# Patient Record
Sex: Female | Born: 1940 | Race: White | Hispanic: No | Marital: Married | Smoking: Never smoker
Health system: Southern US, Community
[De-identification: ages and names within clinical notes are randomized; demographics above are authoritative.]

## PROBLEM LIST (undated history)

## (undated) DIAGNOSIS — R413 Other amnesia: Secondary | ICD-10-CM

## (undated) DIAGNOSIS — M1990 Unspecified osteoarthritis, unspecified site: Secondary | ICD-10-CM

## (undated) DIAGNOSIS — C50919 Malignant neoplasm of unspecified site of unspecified female breast: Secondary | ICD-10-CM

## (undated) DIAGNOSIS — E7800 Pure hypercholesterolemia, unspecified: Secondary | ICD-10-CM

## (undated) DIAGNOSIS — G629 Polyneuropathy, unspecified: Secondary | ICD-10-CM

## (undated) DIAGNOSIS — I700 Atherosclerosis of aorta: Secondary | ICD-10-CM

## (undated) DIAGNOSIS — Z8619 Personal history of other infectious and parasitic diseases: Secondary | ICD-10-CM

## (undated) DIAGNOSIS — E049 Nontoxic goiter, unspecified: Secondary | ICD-10-CM

## (undated) DIAGNOSIS — M810 Age-related osteoporosis without current pathological fracture: Secondary | ICD-10-CM

## (undated) DIAGNOSIS — E119 Type 2 diabetes mellitus without complications: Secondary | ICD-10-CM

## (undated) HISTORY — PX: EYE SURGERY: SHX253

## (undated) HISTORY — PX: REPLACEMENT TOTAL KNEE: SUR1224

## (undated) HISTORY — DX: Pure hypercholesterolemia, unspecified: E78.00

## (undated) HISTORY — PX: MASTECTOMY: SHX3

## (undated) HISTORY — DX: Nontoxic goiter, unspecified: E04.9

## (undated) HISTORY — DX: Type 2 diabetes mellitus without complications: E11.9

## (undated) HISTORY — DX: Unspecified osteoarthritis, unspecified site: M19.90

## (undated) HISTORY — DX: Malignant neoplasm of unspecified site of unspecified female breast: C50.919

## (undated) HISTORY — PX: CATARACT EXTRACTION: SUR2

## (undated) HISTORY — DX: Atherosclerosis of aorta: I70.0

## (undated) HISTORY — DX: Other amnesia: R41.3

---

## 1997-12-13 ENCOUNTER — Ambulatory Visit (HOSPITAL_COMMUNITY): Admission: RE | Admit: 1997-12-13 | Discharge: 1997-12-13 | Payer: Self-pay | Admitting: Family Medicine

## 1998-09-17 ENCOUNTER — Encounter: Payer: Self-pay | Admitting: Family Medicine

## 1998-09-17 ENCOUNTER — Ambulatory Visit (HOSPITAL_COMMUNITY): Admission: RE | Admit: 1998-09-17 | Discharge: 1998-09-17 | Payer: Self-pay | Admitting: Family Medicine

## 2004-02-27 ENCOUNTER — Other Ambulatory Visit: Admission: RE | Admit: 2004-02-27 | Discharge: 2004-02-27 | Payer: Self-pay | Admitting: Family Medicine

## 2005-03-30 ENCOUNTER — Other Ambulatory Visit: Admission: RE | Admit: 2005-03-30 | Discharge: 2005-03-30 | Payer: Self-pay | Admitting: Family Medicine

## 2006-03-31 ENCOUNTER — Other Ambulatory Visit: Admission: RE | Admit: 2006-03-31 | Discharge: 2006-03-31 | Payer: Self-pay | Admitting: Family Medicine

## 2007-07-06 HISTORY — PX: REPLACEMENT TOTAL KNEE: SUR1224

## 2007-08-29 ENCOUNTER — Ambulatory Visit (HOSPITAL_COMMUNITY): Admission: RE | Admit: 2007-08-29 | Discharge: 2007-08-29 | Payer: Self-pay | Admitting: Family Medicine

## 2008-03-25 ENCOUNTER — Inpatient Hospital Stay (HOSPITAL_COMMUNITY): Admission: RE | Admit: 2008-03-25 | Discharge: 2008-03-28 | Payer: Self-pay | Admitting: Orthopedic Surgery

## 2008-05-23 ENCOUNTER — Other Ambulatory Visit: Admission: RE | Admit: 2008-05-23 | Discharge: 2008-05-23 | Payer: Self-pay | Admitting: Family Medicine

## 2009-03-17 ENCOUNTER — Encounter (INDEPENDENT_AMBULATORY_CARE_PROVIDER_SITE_OTHER): Payer: Self-pay | Admitting: *Deleted

## 2009-06-09 ENCOUNTER — Other Ambulatory Visit: Admission: RE | Admit: 2009-06-09 | Discharge: 2009-06-09 | Payer: Self-pay | Admitting: Family Medicine

## 2009-08-20 ENCOUNTER — Ambulatory Visit (HOSPITAL_BASED_OUTPATIENT_CLINIC_OR_DEPARTMENT_OTHER): Admission: RE | Admit: 2009-08-20 | Discharge: 2009-08-20 | Payer: Self-pay | Admitting: Plastic Surgery

## 2009-11-25 ENCOUNTER — Telehealth: Payer: Self-pay | Admitting: Internal Medicine

## 2010-06-19 ENCOUNTER — Ambulatory Visit
Admission: RE | Admit: 2010-06-19 | Discharge: 2010-06-19 | Payer: Self-pay | Source: Home / Self Care | Attending: Plastic Surgery | Admitting: Plastic Surgery

## 2010-07-26 ENCOUNTER — Encounter: Payer: Self-pay | Admitting: Family Medicine

## 2010-07-28 ENCOUNTER — Encounter
Admission: RE | Admit: 2010-07-28 | Discharge: 2010-07-28 | Payer: Self-pay | Source: Home / Self Care | Attending: Family Medicine | Admitting: Family Medicine

## 2010-08-01 NOTE — Op Note (Signed)
NAME:  Caroline, Pratt NO.:  [id number]  MEDICAL RECORD NO.:  [id number]          PATIENT TYPE:  AMB  LOCATION:  DSC                          FACILITY:  [facility]  PHYSICIAN:  Loreta Ave, MD DATE OF BIRTH:  [birth year]  DATE OF PROCEDURE:  06/19/2010 DATE OF DISCHARGE:                              OPERATIVE REPORT   SURGEON:  Loreta Ave, MD  PREOPERATIVE DIAGNOSIS:  Left-sided breast cancer.  POSTOPERATIVE DIAGNOSIS:  Left-sided breast cancer.  PROCEDURES PERFORMED: 1. Removal of left breast tissue expander. 2. Capsulotomy. 3. Placement of left breast permanent saline implant.  COMPONENTS USED:  A Mentor smooth round high profile saline implant, reference number 3500, lot number [id number], serial number [id number] 047, serial volume is 525 mL.  IV FLUIDS:  Per Anesthesia.  URINE OUTPUT:  Not recorded.  COMPLICATIONS:  None.  CLINICAL INDICATIONS:  Caroline Pratt is a 70 year old Caucasian female who is status post left mastectomy and breast reconstruction.  She suffered implant rupture with deflation of the left breast implant and she was then brought in my attention.  At that time as the laps seems to be deflation of her implant, it should be re-expansion before placement of a subsequent implant.  She understood the risks of surgery to include but not limited bleeding, infection, damage to nearby structures, stiffness, scarring, implant-related complications such as fatigue, migration, failure, breast asymmetry, and the need for more surgery. She desired to proceed.  DESCRIPTION OF OPERATION:  The patient was brought to the operating room and placed in the supine position on the operating room table.  After smooth and routine induction of general anesthesia with laryngeal mask airway, the patient's chest was prepped with ChloraPrep and draped into the sterile field.  Ancef 1 g was given.  A 5 mL of 0.5% Marcaine with epinephrine were  injected about the left mastectomy scar.  The left mastectomy scar was then incised with 15-blade and excised and sent to pathology labeled as left mastectomy scar.  Electrocautery dissection was used moving on the lateral portion of the breast, port the implant. The implant was encountered freed with digital dissection, ruptured with a 15-blade, and removed.  Next, an inferior capsulotomy was made along the inferior third of the breast just off the chest wall and a grid pattern was cut into the capsule approximately 2 cm x 2 cm over the inferior third of the breast to allow inferior pole expansion.  The left mastectomy defect was then irrigated with normal saline and hemostasis was verified with electrocautery.  The left mastectomy defect was then irrigated with bacitracin containing normal saline and then a sizer was placed.  We then filled with 500 mL of injectable normal saline and this was compared with the opposite breast.  We then fill the 560 and seems to be too large to the compared to the right breast.  The decision was made to proceed with a 500 mL implant, which was then brought onto the field and maintained in the bacitracin containing bath in normal saline. All residual air was aspirated out.  It was placed in the left  mastectomy defect and filled with a closed fill circuit to 525 mL. Symmetry was very good.  Next, the capsule layer overlying the implant was closed with interrupted 3-0 Monocryl sutures taking care not to touch the implant anytime with needle suture or instrument.  The skin was then closed with interrupted buried 3-0 Monocryl sutures and a running 4-0 subcuticular Monocryl stitch.  Dermabond and Steri-Strips as well as a compressive wrap with padding in the axilla to medial at the breast was then placed.  She was extubated and transferred to the recovery room in stable addition.     Loreta Ave, MD     CF/MEDQ  D:  06/19/2010  T:  06/20/2010   Job:  [id number]  Electronically Signed by Loreta Ave MD on 08/01/2010 03:14:13 PM

## 2010-08-06 NOTE — Letter (Signed)
Summary: Recall Colonoscopy Letter  [facility]  [address] [place], [place] [address]   Phone: [phone]  Fax: [phone]      March 17, 2009 MRN: [id number]   Caroline Pratt [address] [place], [place]  [address]   Dear Ms. Caroline Pratt,   According to your medical record, it is time for you to schedule a Colonoscopy. The American Cancer Society recommends this procedure as a method to detect early colon cancer. Patients with a family history of colon cancer, or a personal history of colon polyps or inflammatory bowel disease are at increased risk.  This letter has beeen generated based on the recommendations made at the time of your procedure. If you feel that in your particular situation this may no longer apply, please contact our office.  Please call our office at [phone] to schedule this appointment or to update your records at your earliest convenience.  Thank you for cooperating with [place] to provide you with the very best care possible.   Sincerely,  Hedwig Morton. Juanda Chance, M.D.  [facility] [phone]

## 2010-08-06 NOTE — Progress Notes (Signed)
Summary: Schedule Colonoscopy  Phone Note Outgoing Call Call back at [facility] Phone [phone]   Call placed by: Harlow Mares CMA Duncan Dull),  Nov 25, 2009 12:04 PM Call placed to: Patient Summary of Call: patient is declining to have her colonoscopy done I have explained the importance of having her colonoscopy she still declined.  Initial call taken by: Harlow Mares CMA Duncan Dull),  Nov 25, 2009 12:14 PM

## 2010-09-14 LAB — POCT HEMOGLOBIN-HEMACUE: Hemoglobin: 14.8 g/dL (ref 12.0–15.0)

## 2010-09-23 LAB — POCT HEMOGLOBIN-HEMACUE: Hemoglobin: 13.9 g/dL (ref 12.0–15.0)

## 2010-11-17 NOTE — Op Note (Signed)
NAME:  Caroline Pratt, KUNST NO.:  [id number]   MEDICAL RECORD NO.:  [id number]          PATIENT TYPE:  INP   LOCATION:  5031                         FACILITY:  [facility]   PHYSICIAN:  Feliberto Gottron. Turner Daniels, M.D.   DATE OF BIRTH:  [birth year]   DATE OF PROCEDURE:  03/25/2008  DATE OF DISCHARGE:                               OPERATIVE REPORT   PREOPERATIVE DIAGNOSIS:  End-stage arthritis, right knee.   POSTOPERATIVE DIAGNOSIS:  End-stage arthritis, right knee.   PROCEDURE:  Right total knee arthroplasty, DePuy Sigma RP components,  all cemented, 3 femur, 4 tibia and 35 patella, 10-mm Sigma RP bearing.   SURGEON:  Feliberto Gottron. Turner Daniels, MD   FIRST ASSISTANT:  Shirl Harris, PA-C   ANESTHETIC:  General endotracheal.   ESTIMATED BLOOD LOSS:  Minimal.   FLUID REPLACEMENT:  1500 mL of crystalloid.   DRAINS PLACED:  Foley catheter and 2 medium Hemovacs.   URINE OUTPUT:  300 mL.   TOURNIQUET TIME:  1 hour 20 minutes.   INDICATIONS FOR PROCEDURE:  A 70 year old woman with end-stage arthritis  of right greater than left knee who has failed conservative treatment  over the last few years with anti-inflammatory medicines, physical  therapy, cortisone injections and viscosupplementation.  She now desires  elective right total knee arthroplasty for bone-on-bone arthritic  changes verified by x-ray.  Risks and benefits of surgery were  discussed.  Questions answered.   DESCRIPTION OF PROCEDURE:  The patient was identified by armband and  underwent a right femoral nerve block in the block area at [facility].  She was then taken to operating room 1, the appropriate  anesthetic monitors were reattached, and general endotracheal anesthesia  induced with the patient in supine position.  She received a gram of  vancomycin preoperatively IV and a Foley catheter was inserted.  Tourniquet applied high to the right thigh, and the right lower  extremity prepped and draped in usual  sterile fashion.  The lateral post  and foot positioner applied to the table, standard time-out procedure  was then performed.  The limb was wrapped with an Esmarch bandage,  tourniquet inflated to 350 mmHg.  We began the procedure by making a  standard anterior midline incision starting handbreadth above the  patella going over the patella 1 cm medial and 2 and 3 cm distal to the  tibial tubercle.  Length of incision was about 18 cm.  Small bleeders in  the skin and the subcutaneous tissue were identified and cauterized.  Transverse retinaculum over the patella was incised and reflected  medially allowing a medial parapatellar arthrotomy.  The patella was  everted.  Prepatellar fat pad was resected.  Superficial medial  collateral ligament was elevated from the anterior to posterior off the  proximal tibia leaving intact distally to loosen up the MCL because of  her varus deformity.  At this point, the patella was everted.  The knee  was flexed.  The anterior one half of the menisci were resected as were  the cruciate ligaments and notch osteophytes removed.  Posteromedial Z  retractor and McHale retractor through the notch and a lateral Hohmann  retractor were then placed exposing the proximal tibia, which was then  entered with the DePuy step drill pretty much dead center in relation to  the tibial plateau.  This was followed by the IM rod set for 2 degrees  posterior slope on the cutting guide removing about 7 or 8 mm of bone  medially and almost a full centimeter of bone laterally.  Satisfied with  a proximal tibial cut, we then directed our attention to the distal  femur which was entered 2 mm anterior to the PCL origin with a step  drill followed by the IM rod set for 5 degrees right and 11-mm distal  cut.  The cutting guide was pinned along the epicondylar axis and the  distal femoral cut accomplished without difficulty.  We sized for a 3  right femoral component and drilled the  guide pins in 3 degrees of  external rotation followed by the chamfer cutting guide, which was held  in place with the screw pins.  Anterior, posterior, and chamfer cuts  were then accomplished without difficulty as was the DePuy Sigma RP box  cut with the box cutting guide.  Patella was measured at 23 mm  relatively small patella.  We set the cutting guide at 14 and removed  the posterior 9 mm of the patella, sized for a 35 button, and drilled.  At this point, the proximal tibia was once again exposed by hyperflexing  the knee.  We sized for 4 tibial baseplate, which was pinned into place  followed by the conical reaming tower and the conical reamer.  The  Deltafit keel punch was then applied.  The trial #3 right femoral  component was then hammered into place and the lugs were drilled.  We  snapped in a 10-mm bearing between the tibial trial baseplate and the  femur.  The knee came to full extension, flexed to 130 degrees, and  ligament laxity was minimal.  The trial patella tracked with no thumb  pressure.  At this point, the trial components were removed.  All bony  surfaces were WaterPik clean and dry with suction and sponges, and a  double batch of DePuy HV cement with 1500 mg of Zinacef was then mixed  and applied to all bony and metallic mating surfaces except for the  posterior condyles of the femur itself.  In order, we hammered into  place a #4 tibial baseplate and removed excess cement, a 3 right femoral  component and removed excess cement, and a 35-mm patellar button and  also removed excess cement.  This was held in place with a clamp.  A 10-  mm bearing was then snapped into place and the knee reduced, came to  full extension.  Excess cement was once again removed as we applied  compression, and the cement cured.  Medium Hemovac drains were then  placed deep in the wound using the lateral side and after again  irrigating the knee with normal saline solution, we checked our  patellar  tracking one more time after the cement had cured and the parapatellar  arthrotomy was closed with running #1 Vicryl suture.  The subcutaneous  tissue with 0 and 2-0 undyed Vicryl suture, and the skin with skin  staples.  A dressing of Xeroform, 4 x 4 dressing sponges, Webril, and  Ace wrap was then applied.  Tourniquet let down.  The patient awakened  and taken  to the recovery room without difficulty.      Feliberto Gottron. Turner Daniels, M.D.  Electronically Signed     FJR/MEDQ  D:  03/25/2008  T:  03/26/2008  Job:  [id number]

## 2010-11-17 NOTE — Discharge Summary (Signed)
NAME:  Caroline Pratt, Caroline Pratt NO.:  [id number]   MEDICAL RECORD NO.:  [id number]          PATIENT TYPE:  INP   LOCATION:  5031                         FACILITY:  [facility]   PHYSICIAN:  Feliberto Gottron. Turner Daniels, M.D.   DATE OF BIRTH:  24-Feb-1941   DATE OF ADMISSION:  03/25/2008  DATE OF DISCHARGE:  03/28/2008                               DISCHARGE SUMMARY   CHIEF COMPLAINT:  Right knee pain.   HISTORY OF PRESENT ILLNESS:  This is a 70 year old lady who complains of  unremitting pain in her right knee despite conservative treatment with  antiinflammatories, narcotic pain medicines, viscosupplementation and  knee arthroscopy.  She desires a surgical intervention at this time.  All risks and benefits of surgery were discussed with the patient.   PAST MEDICAL HISTORY:  Significant for breast cancer in 1990.   PAST SURGICAL HISTORY:  She had a mastectomy and a right knee  arthroscopy.   SOCIAL HISTORY:  She is a nonsmoker and does not drink alcohol.  She  lives with her husband.   FAMILY HISTORY:  Noncontributory.   ALLERGIES:  She has no known drug allergies.   DAILY MEDICATIONS:  1. Pravastatin 80 mg one p.o. daily.  2. Alendronate 70 mg one p.o. q. weekly.  3. Aspirin 81 mg one p.o. daily.  4. Temazepam one p.o. p.r.n. anxiety.  5. Fish oil.  6. Multivitamin.   PHYSICAL EXAMINATION:  Gross examination of the right knee demonstrates  that the patient has a 1+ effusion.  Range of motion is estimated to be  0-120 degrees.  She is neurovascularly intact.  X-rays demonstrate bone-  on-bone degenerative joint disease of the right knee.   PREOPERATIVE LABORATORY DATA:  White blood cells 6.0, red blood cells  4.45, hemoglobin 14.0, hematocrit 40.9, platelets 250.  PT 12.7, INR  0.9, PTT 30.  Sodium 140, potassium 4.4, chloride 105, glucose 95, BUN  14, creatinine 0.84.  Urinalysis demonstrates trace leukocyte esterase,  otherwise within normal limits.   HOSPITAL COURSE:  Ms.  Winther was admitted to [facility] on March 25, 2008, when she underwent a right total knee arthroplasty using the  DePuy System.  The surgery was performed by Gean Birchwood.  A  perioperative Foley catheter was placed and two Hemovac drains were  placed into the right knee.  The patient tolerated the procedure well  and was transferred to the orthopedic floor.  On the first postoperative  day, the patient was awake, alert and taking p.o. well.  Her hemoglobin  was 10.4.  Her surgical drains were removed without difficulty, and she  was evaluated by physical therapy.  On the second postoperative day, the  patient was eating well and passing flatus.  Her surgical dressing was  changed.  Her incision was found to be benign.  Hemoglobin was 10.5.  She was able to ambulate 180 feet with physical therapy.  On the third  postoperative day, the patient was eating well, voiding and ambulating  independently.  She had climbed stairs with physical therapy, hemoglobin  was 9.6, surgical dressing was  clean and dry.  She was discharged to her  home.   DISPOSITION:  The patient was discharged home on March 28, 2008.  [facility] her wound, Coumadin, and physical therapy.   Discharge medicines were as per the HMR with the addition of Coumadin 5  mg tablets to take as directed with a target INR of 1.5 to 2 and  Percocet 5 mg tablets 1-2 tablets p.o. q.4 h. p.r.n. pain.  She was  weightbearing as tolerated and will return to the clinic in 1 week for  reevaluation by Dr. Turner Daniels.   FINAL DIAGNOSIS:  End-stage degenerative joint disease.      Shirl Harris, PA      Feliberto Gottron. Turner Daniels, M.D.  Electronically Signed    JW/MEDQ  D:  05/12/2008  T:  05/12/2008  Job:  [id number]

## 2011-02-10 ENCOUNTER — Encounter (INDEPENDENT_AMBULATORY_CARE_PROVIDER_SITE_OTHER): Payer: Medicare Other | Admitting: Ophthalmology

## 2011-02-10 DIAGNOSIS — H35379 Puckering of macula, unspecified eye: Secondary | ICD-10-CM

## 2011-02-10 DIAGNOSIS — H43819 Vitreous degeneration, unspecified eye: Secondary | ICD-10-CM

## 2011-02-10 DIAGNOSIS — H3530 Unspecified macular degeneration: Secondary | ICD-10-CM

## 2011-02-10 DIAGNOSIS — H35359 Cystoid macular degeneration, unspecified eye: Secondary | ICD-10-CM

## 2011-03-16 ENCOUNTER — Encounter (INDEPENDENT_AMBULATORY_CARE_PROVIDER_SITE_OTHER): Payer: Medicare Other | Admitting: Ophthalmology

## 2011-03-18 ENCOUNTER — Encounter (INDEPENDENT_AMBULATORY_CARE_PROVIDER_SITE_OTHER): Payer: Medicare Other | Admitting: Ophthalmology

## 2011-03-18 DIAGNOSIS — H35349 Macular cyst, hole, or pseudohole, unspecified eye: Secondary | ICD-10-CM

## 2011-03-18 DIAGNOSIS — H43819 Vitreous degeneration, unspecified eye: Secondary | ICD-10-CM

## 2011-03-18 DIAGNOSIS — H35379 Puckering of macula, unspecified eye: Secondary | ICD-10-CM

## 2011-03-25 ENCOUNTER — Ambulatory Visit (HOSPITAL_COMMUNITY)
Admission: RE | Admit: 2011-03-25 | Discharge: 2011-03-26 | Disposition: A | Discharge status: Home / Self Care | Payer: Medicare Other | Source: Ambulatory Visit | Attending: Ophthalmology | Admitting: Ophthalmology

## 2011-03-25 DIAGNOSIS — H26499 Other secondary cataract, unspecified eye: Secondary | ICD-10-CM

## 2011-03-25 DIAGNOSIS — H35349 Macular cyst, hole, or pseudohole, unspecified eye: Secondary | ICD-10-CM

## 2011-03-25 DIAGNOSIS — Z853 Personal history of malignant neoplasm of breast: Secondary | ICD-10-CM | POA: Insufficient documentation

## 2011-03-25 DIAGNOSIS — H35379 Puckering of macula, unspecified eye: Secondary | ICD-10-CM

## 2011-03-25 DIAGNOSIS — Z01812 Encounter for preprocedural laboratory examination: Secondary | ICD-10-CM | POA: Insufficient documentation

## 2011-03-25 LAB — CBC
HCT: 37.9 % (ref 36.0–46.0)
Hemoglobin: 13.5 g/dL (ref 12.0–15.0)
MCH: 31.3 pg (ref 26.0–34.0)
MCHC: 35.6 g/dL (ref 30.0–36.0)
MCV: 87.7 fL (ref 78.0–100.0)
Platelets: 248 10*3/uL (ref 150–400)
RBC: 4.32 MIL/uL (ref 3.87–5.11)
RDW: 12.8 % (ref 11.5–15.5)
WBC: 5.9 10*3/uL (ref 4.0–10.5)

## 2011-03-25 LAB — AUTOLOGOUS SERUM PATCH PREP

## 2011-03-25 LAB — SURGICAL PCR SCREEN
MRSA, PCR: NEGATIVE
Staphylococcus aureus: NEGATIVE

## 2011-04-01 ENCOUNTER — Inpatient Hospital Stay (INDEPENDENT_AMBULATORY_CARE_PROVIDER_SITE_OTHER): Payer: Medicare Other | Admitting: Ophthalmology

## 2011-04-01 DIAGNOSIS — H35349 Macular cyst, hole, or pseudohole, unspecified eye: Secondary | ICD-10-CM

## 2011-04-05 LAB — URINALYSIS, ROUTINE W REFLEX MICROSCOPIC
Bilirubin Urine: NEGATIVE
Glucose, UA: NEGATIVE
Hgb urine dipstick: NEGATIVE
Ketones, ur: NEGATIVE
Nitrite: NEGATIVE
Protein, ur: NEGATIVE
Specific Gravity, Urine: 1.012
Urobilinogen, UA: 0.2
pH: 7

## 2011-04-05 LAB — CBC
HCT: 27.6 — ABNORMAL LOW
HCT: 29.6 — ABNORMAL LOW
HCT: 30.4 — ABNORMAL LOW
HCT: 40.9
Hemoglobin: 10.4 — ABNORMAL LOW
Hemoglobin: 10.5 — ABNORMAL LOW
Hemoglobin: 14
Hemoglobin: 9.6 — ABNORMAL LOW
MCHC: 34.3
MCHC: 34.6
MCHC: 34.9
MCHC: 35.2
MCV: 90
MCV: 90.6
MCV: 91.4
MCV: 91.8
Platelets: 159
Platelets: 174
Platelets: 183
Platelets: 250
RBC: 3.04 — ABNORMAL LOW
RBC: 3.29 — ABNORMAL LOW
RBC: 3.33 — ABNORMAL LOW
RBC: 4.45
RDW: 12.3
RDW: 12.4
RDW: 12.5
RDW: 12.6
WBC: 6
WBC: 7.6
WBC: 8.1
WBC: 8.1

## 2011-04-05 LAB — BASIC METABOLIC PANEL
BUN: 14
BUN: 8
CO2: 24
CO2: 28
Calcium: 10.6 — ABNORMAL HIGH
Calcium: 8.3 — ABNORMAL LOW
Chloride: 103
Chloride: 105
Creatinine, Ser: 0.57
Creatinine, Ser: 0.84
GFR calc Af Amer: >60
GFR calc Af Amer: >60
GFR calc non Af Amer: >60
GFR calc non Af Amer: >60
Glucose, Bld: 147 — ABNORMAL HIGH
Glucose, Bld: 95
Potassium: 3.6
Potassium: 4.4
Sodium: 132 — ABNORMAL LOW
Sodium: 140

## 2011-04-05 LAB — DIFFERENTIAL
Basophils Absolute: 0
Basophils Relative: 0
Eosinophils Absolute: 0.1
Eosinophils Relative: 2
Lymphocytes Relative: 22
Lymphs Abs: 1.3
Monocytes Absolute: 0.5
Monocytes Relative: 9
Neutro Abs: 4
Neutrophils Relative %: 68

## 2011-04-05 LAB — URINE MICROSCOPIC-ADD ON

## 2011-04-05 LAB — TYPE AND SCREEN
ABO/RH(D): A POS
Antibody Screen: NEGATIVE

## 2011-04-05 LAB — APTT: aPTT: 30

## 2011-04-05 LAB — ABO/RH: ABO/RH(D): A POS

## 2011-04-05 LAB — PROTIME-INR
INR: 0.9
INR: 1.1
INR: 1.4
INR: 1.6 — ABNORMAL HIGH
Prothrombin Time: 12.7
Prothrombin Time: 14.4
Prothrombin Time: 18.1 — ABNORMAL HIGH
Prothrombin Time: 19.5 — ABNORMAL HIGH

## 2011-04-13 NOTE — Op Note (Signed)
  NAME:  Caroline Pratt, Caroline Pratt NO.:  [id number]  MEDICAL RECORD NO.:  [id number]  LOCATION:  5125                         FACILITY:  [facility]  PHYSICIAN:  Beulah Gandy. Ashley Royalty, M.D. DATE OF BIRTH:  [birth year]  DATE OF PROCEDURE:  03/25/2011 DATE OF DISCHARGE:                              OPERATIVE REPORT   ADMISSION DIAGNOSES:  Preretinal fibrosis, peripheral retinal thinning, and macular hole; left eye.  PROCEDURES:  Pars plana vitrectomy, retinal photocoagulation, gas-fluid exchange, membrane peel, serum patch; left eye.  SURGEON:  Beulah Gandy. Ashley Royalty, MD  ASSISTANT:  Rosalie Doctor, MA, SA.  ANESTHESIA:  General.  DETAILS:  Usual prep and drape.  The indirect ophthalmoscope laser was moved into place.  The peripheral retina was inspected.  Several thin areas were treated with 377 burns of diode laser, power was 300 milliwatts, 1000 microns each and 0.1 seconds each.  Attention was then carried to the pars plana area where 25-gauge trocars were placed at 10, 2, and 4 o'clock.  Contact lens ring was placed.  Provisc was placed and the flat contact lens was placed on the corneal surface.  The pars plana vitrectomy was begun just behind the pseudophakos where a posterior capsulectomy was performed to improve the visualization of the macula. The vitrectomy was carried posteriorly.  Dense membranes were encountered.  There was a surface glistening membrane.  This was engaged with the lighted pick and the 25-gauge forceps.  It was peeled from its attachments to the disk and to the macular region.  It came off in one large sheet.  The surface membrane extended into the periphery and the 30-degree prismatic lens was used for viewing, then the vitrectomy was carried into the mid periphery where surface proliferation was removed and the vitreous was removed.  The vitrectomy was carried out to the vitreous base where it was trimmed for 360 degrees.  The diamond dusted membrane  scraper was used to inspect the macular region and to remove the internal limiting membrane.  The retina was thrown into folds around the macular region and the macular hole was tiny.  Once the macular region was freed of membranes, a total gas-fluid exchange was performed. Serum patch was delivered.  Additional fluid was removed after time passed and fluid collected in the posterior segment.  Once this fluid was removed and the serum patch was delivered, additional fluid was removed.  C3F8 was mixed into a 14% concentration.  It was exchanged for intravitreal gas.  The 25-gauge trocars were removed.  The wounds were tested and found to be tight.  Polymyxin and gentamicin were irrigated in the [place] space.  Atropine solution was applied. Decadron 10 mg was injected to the lower subconjunctival space. Marcaine was injected around the globe for postop pain.  Closing pressure was 10 with a Risk manager.  Complications none.  Duration 1 hour.  The patient was awakened and taken to recovery in satisfactory condition.     Beulah Gandy. Ashley Royalty, M.D.     JDM/MEDQ  D:  03/25/2011  T:  03/25/2011  Job:  [id number]  Electronically Signed by Alan Mulder M.D. on 04/13/2011 05:31:24 PM

## 2011-04-23 ENCOUNTER — Encounter (INDEPENDENT_AMBULATORY_CARE_PROVIDER_SITE_OTHER): Payer: Medicare Other | Admitting: Ophthalmology

## 2011-04-23 DIAGNOSIS — H35349 Macular cyst, hole, or pseudohole, unspecified eye: Secondary | ICD-10-CM

## 2011-06-02 ENCOUNTER — Encounter (INDEPENDENT_AMBULATORY_CARE_PROVIDER_SITE_OTHER): Payer: Medicare Other | Admitting: Ophthalmology

## 2011-06-23 ENCOUNTER — Other Ambulatory Visit: Payer: Self-pay | Admitting: Family Medicine

## 2011-06-23 DIAGNOSIS — E049 Nontoxic goiter, unspecified: Secondary | ICD-10-CM

## 2011-07-09 ENCOUNTER — Encounter (INDEPENDENT_AMBULATORY_CARE_PROVIDER_SITE_OTHER): Payer: Medicare Other | Admitting: Ophthalmology

## 2011-07-09 DIAGNOSIS — H2510 Age-related nuclear cataract, unspecified eye: Secondary | ICD-10-CM

## 2011-07-09 DIAGNOSIS — H35379 Puckering of macula, unspecified eye: Secondary | ICD-10-CM

## 2011-07-09 DIAGNOSIS — H43819 Vitreous degeneration, unspecified eye: Secondary | ICD-10-CM

## 2011-07-09 DIAGNOSIS — H3530 Unspecified macular degeneration: Secondary | ICD-10-CM

## 2011-08-03 ENCOUNTER — Ambulatory Visit
Admission: RE | Admit: 2011-08-03 | Discharge: 2011-08-03 | Disposition: A | Discharge status: Home / Self Care | Payer: Medicare Other | Source: Ambulatory Visit | Attending: Family Medicine | Admitting: Family Medicine

## 2011-08-03 DIAGNOSIS — E049 Nontoxic goiter, unspecified: Secondary | ICD-10-CM

## 2011-08-05 ENCOUNTER — Other Ambulatory Visit: Payer: Self-pay | Admitting: Family Medicine

## 2011-08-05 DIAGNOSIS — E041 Nontoxic single thyroid nodule: Secondary | ICD-10-CM

## 2011-08-10 ENCOUNTER — Other Ambulatory Visit (HOSPITAL_COMMUNITY)
Admission: RE | Admit: 2011-08-10 | Discharge: 2011-08-10 | Disposition: A | Discharge status: Home / Self Care | Payer: Medicare Other | Source: Ambulatory Visit | Attending: Interventional Radiology | Admitting: Interventional Radiology

## 2011-08-10 ENCOUNTER — Ambulatory Visit
Admission: RE | Admit: 2011-08-10 | Discharge: 2011-08-10 | Disposition: A | Discharge status: Home / Self Care | Payer: Medicare Other | Source: Ambulatory Visit | Attending: Family Medicine | Admitting: Family Medicine

## 2011-08-10 DIAGNOSIS — E049 Nontoxic goiter, unspecified: Secondary | ICD-10-CM | POA: Insufficient documentation

## 2011-08-10 DIAGNOSIS — E041 Nontoxic single thyroid nodule: Secondary | ICD-10-CM

## 2012-01-14 ENCOUNTER — Ambulatory Visit (INDEPENDENT_AMBULATORY_CARE_PROVIDER_SITE_OTHER): Payer: Medicare Other | Admitting: Ophthalmology

## 2012-01-14 DIAGNOSIS — H35379 Puckering of macula, unspecified eye: Secondary | ICD-10-CM

## 2012-01-14 DIAGNOSIS — E1165 Type 2 diabetes mellitus with hyperglycemia: Secondary | ICD-10-CM

## 2012-01-14 DIAGNOSIS — H2510 Age-related nuclear cataract, unspecified eye: Secondary | ICD-10-CM

## 2012-01-14 DIAGNOSIS — E11319 Type 2 diabetes mellitus with unspecified diabetic retinopathy without macular edema: Secondary | ICD-10-CM

## 2012-01-14 DIAGNOSIS — E1139 Type 2 diabetes mellitus with other diabetic ophthalmic complication: Secondary | ICD-10-CM

## 2012-01-14 DIAGNOSIS — H43819 Vitreous degeneration, unspecified eye: Secondary | ICD-10-CM

## 2012-01-14 DIAGNOSIS — H3530 Unspecified macular degeneration: Secondary | ICD-10-CM

## 2012-02-10 ENCOUNTER — Encounter (INDEPENDENT_AMBULATORY_CARE_PROVIDER_SITE_OTHER): Payer: Self-pay | Admitting: Ophthalmology

## 2012-08-08 ENCOUNTER — Other Ambulatory Visit: Payer: Self-pay | Admitting: Family Medicine

## 2012-08-08 DIAGNOSIS — E049 Nontoxic goiter, unspecified: Secondary | ICD-10-CM

## 2012-08-15 ENCOUNTER — Ambulatory Visit
Admission: RE | Admit: 2012-08-15 | Discharge: 2012-08-15 | Disposition: A | Discharge status: Home / Self Care | Payer: Medicare Other | Source: Ambulatory Visit | Attending: Family Medicine | Admitting: Family Medicine

## 2012-08-15 DIAGNOSIS — E049 Nontoxic goiter, unspecified: Secondary | ICD-10-CM

## 2012-10-27 ENCOUNTER — Ambulatory Visit (INDEPENDENT_AMBULATORY_CARE_PROVIDER_SITE_OTHER): Payer: Medicare Other | Admitting: Ophthalmology

## 2012-10-27 DIAGNOSIS — H2510 Age-related nuclear cataract, unspecified eye: Secondary | ICD-10-CM

## 2012-10-27 DIAGNOSIS — H35379 Puckering of macula, unspecified eye: Secondary | ICD-10-CM

## 2012-10-27 DIAGNOSIS — H3530 Unspecified macular degeneration: Secondary | ICD-10-CM

## 2012-10-27 DIAGNOSIS — H43819 Vitreous degeneration, unspecified eye: Secondary | ICD-10-CM

## 2012-10-27 DIAGNOSIS — H35359 Cystoid macular degeneration, unspecified eye: Secondary | ICD-10-CM

## 2012-12-28 ENCOUNTER — Encounter (INDEPENDENT_AMBULATORY_CARE_PROVIDER_SITE_OTHER): Payer: Medicare Other | Admitting: Ophthalmology

## 2013-12-05 DIAGNOSIS — E042 Nontoxic multinodular goiter: Secondary | ICD-10-CM | POA: Insufficient documentation

## 2013-12-05 DIAGNOSIS — E785 Hyperlipidemia, unspecified: Secondary | ICD-10-CM | POA: Insufficient documentation

## 2014-11-26 DIAGNOSIS — Z0000 Encounter for general adult medical examination without abnormal findings: Secondary | ICD-10-CM | POA: Insufficient documentation

## 2014-11-28 DIAGNOSIS — Z853 Personal history of malignant neoplasm of breast: Secondary | ICD-10-CM | POA: Insufficient documentation

## 2014-11-28 DIAGNOSIS — C50919 Malignant neoplasm of unspecified site of unspecified female breast: Secondary | ICD-10-CM | POA: Insufficient documentation

## 2015-05-09 DIAGNOSIS — H8110 Benign paroxysmal vertigo, unspecified ear: Secondary | ICD-10-CM | POA: Insufficient documentation

## 2015-07-22 ENCOUNTER — Telehealth: Payer: Self-pay | Admitting: Genetic Counselor

## 2015-07-22 NOTE — Telephone Encounter (Signed)
Answered Ms. Hocker's questions about genetic testing.  She has a personal history of breast cancer at 76 and some cousins who have also had breast cancer.  As discussed, it sounds like she might meet criteria for genetic testing.  Her daughter's doctor is urging her to have genetic testing.  I discussed how the process works, that we would have her come in for genetic counseling and then draw blood here and send testing off to outside lab.  Discussed how insurance coverage works.  I gave her the scheduler's phone number to make an appointment, if she is interested in setting up an appointment.

## 2015-12-02 ENCOUNTER — Other Ambulatory Visit: Payer: Self-pay | Admitting: Endocrinology

## 2015-12-02 DIAGNOSIS — E042 Nontoxic multinodular goiter: Secondary | ICD-10-CM

## 2015-12-05 ENCOUNTER — Ambulatory Visit
Admission: RE | Admit: 2015-12-05 | Discharge: 2015-12-05 | Disposition: A | Discharge status: Home / Self Care | Payer: Medicare Other | Source: Ambulatory Visit | Attending: Endocrinology | Admitting: Endocrinology

## 2015-12-05 DIAGNOSIS — E042 Nontoxic multinodular goiter: Secondary | ICD-10-CM

## 2016-08-17 DIAGNOSIS — G609 Hereditary and idiopathic neuropathy, unspecified: Secondary | ICD-10-CM | POA: Insufficient documentation

## 2017-06-13 DIAGNOSIS — Z794 Long term (current) use of insulin: Secondary | ICD-10-CM | POA: Insufficient documentation

## 2017-09-26 DIAGNOSIS — M25561 Pain in right knee: Secondary | ICD-10-CM | POA: Insufficient documentation

## 2017-10-13 ENCOUNTER — Other Ambulatory Visit: Payer: Self-pay | Admitting: Physician Assistant

## 2017-10-13 DIAGNOSIS — M545 Low back pain: Secondary | ICD-10-CM

## 2017-10-21 ENCOUNTER — Ambulatory Visit
Admission: RE | Admit: 2017-10-21 | Discharge: 2017-10-21 | Disposition: A | Discharge status: Home / Self Care | Payer: Medicare Other | Source: Ambulatory Visit | Attending: Physician Assistant | Admitting: Physician Assistant

## 2017-10-21 DIAGNOSIS — M545 Low back pain: Secondary | ICD-10-CM

## 2017-11-08 ENCOUNTER — Encounter: Payer: Medicare Other | Admitting: Neurology

## 2017-11-11 ENCOUNTER — Ambulatory Visit: Payer: Medicare Other | Admitting: Diagnostic Neuroimaging

## 2017-11-11 ENCOUNTER — Encounter: Payer: Self-pay | Admitting: Diagnostic Neuroimaging

## 2017-11-11 VITALS — BP 118/65 | HR 70 | Ht 65.0 in | Wt 133.0 lb

## 2017-11-11 DIAGNOSIS — M5136 Other intervertebral disc degeneration, lumbar region: Secondary | ICD-10-CM | POA: Diagnosis not present

## 2017-11-11 DIAGNOSIS — E1142 Type 2 diabetes mellitus with diabetic polyneuropathy: Secondary | ICD-10-CM | POA: Diagnosis not present

## 2017-11-11 NOTE — Patient Instructions (Addendum)
-   continue PT exercises  - may use tylenol, ibuprofen, naproxen as needed  - continue lyrica (per Dr. Forde Dandy; may increase dose or switch to gabapentin or duloxetine)

## 2017-11-11 NOTE — Progress Notes (Signed)
[facility]  PATIENT: Caroline Pratt DOB: [birth year]  REFERRING CLINICIAN: [name], MD HISTORY FROM: patient nd husband and chart review  REASON FOR VISIT: new consult    HISTORICAL  CHIEF COMPLAINT:  Chief Complaint  Patient presents with  . New Patient (Initial Visit)    pain, numbness; intermittent, feet, legs, arms. She has been worked up for back problems, says it was negative   . Referral    [name], MD  . Room 6    She is here with her husband    HISTORY OF PRESENT ILLNESS:   77 year old female with lower extremity pain and numbness.  In 2008 patient fell down and had a right knee injury.  She "tore up" her right knee and had severe pain.  This was treated conservatively but ultimately she required arthroscopic procedure and then right total knee replacement in 2009.  Since that time she has continued to have pain around her right knee and right leg.  Now pain has spread to her right hip, thigh, knee, lower leg and foot.  Possibility of lumbar radiculopathy was raised and therefore patient had MRI of the lumbar spine. She had degenerative spine disease without significant nerve root compression.  Around 2012 patient was diagnosed with diabetes.  She was initially treated with metformin, then switched to insulin.  Hemoglobin A1c has ranged from 6.2 up to 7.2 currently.  2018 patient has had increasing cramps in her legs.  She is also having intermittent numbness in her hands.  She has intermittent numbness and tingling in her lower tremors.  Patient's diabetes is currently being managed by Dr. Forde Dandy.  He diagnosed diabetic nerve pain and started patient on Lyrica 50 mg at bedtime.  This seems to help a little bit with symptoms but medication is a very expensive.  Patient has seen a variety of orthopedic surgeons for evaluation.  No surgical treatment options have been identified.  Patient referred here for neurologic consultation and consideration  of possible EMG nerve conduction study to better define etiology of symptoms.   REVIEW OF SYSTEMS: Full 14 system review of systems performed and negative with exception of: Numbness.  ALLERGIES: Not on File  HOME MEDICATIONS: Outpatient Medications Prior to Visit  Medication Sig Dispense Refill  . Acetaminophen (TYLENOL 8 HOUR ARTHRITIS PAIN PO) Take 1 tablet by mouth at bedtime.    Marland Kitchen aspirin (BAYER ASPIRIN EC LOW DOSE) 81 MG EC tablet Take 81 mg by mouth daily. Swallow whole.    Marland Kitchen atorvastatin (LIPITOR) 10 MG tablet Take 5 mg by mouth daily.     Marland Kitchen BIOTIN PO Take 500 mg by mouth daily.    . Calcium Carb-Cholecalciferol (CALCIUM 600/VITAMIN D3 PO) Take 1 tablet by mouth daily.    . empagliflozin (JARDIANCE) 25 MG TABS tablet Take 25 mg by mouth daily.    . Insulin Glargine (LANTUS SOLOSTAR) 100 UNIT/ML Solostar Pen Inject 16 Units into the skin daily.     . insulin lispro (HUMALOG KWIKPEN) 100 UNIT/ML KiwkPen Inject into the skin 3 (three) times daily. 2 units in the morning, 4 units at lunch, and 6 units at dinner    . Multiple Vitamins-Minerals (OCUVITE ADULT 50+ PO) Take 1 tablet by mouth daily.    Marland Kitchen OVER THE COUNTER MEDICATION Take 970 mg by mouth. Diaplex 1 tablet in AM and 2 tablets at bedtime    . OVER THE COUNTER MEDICATION Take 1 tablet by mouth daily. Lion's Mane    .  pregabalin (LYRICA) 50 MG capsule Take 50 mg by mouth at bedtime.     Marland Kitchen aspirin 81 MG chewable tablet Chew 81 mg by mouth.     No facility-administered medications prior to visit.     PAST MEDICAL HISTORY: Past Medical History:  Diagnosis Date  . Breast cancer ([place])   . Diabetes ([place])   . High cholesterol   . Osteoarthritis     PAST SURGICAL HISTORY: Past Surgical History:  Procedure Laterality Date  . EYE SURGERY    . MASTECTOMY Left   . REPLACEMENT TOTAL KNEE Right     FAMILY HISTORY: Family History  Problem Relation Age of Onset  . Stroke Mother   . Heart attack Father     SOCIAL  HISTORY:  Social History   Socioeconomic History  . Marital status: Married    Spouse name: Not on file  . Number of children: 2  . Years of education: Not on file  . Highest education level: High school graduate  Occupational History  . Not on file  Social Needs  . Financial resource strain: Not on file  . Food insecurity:    Worry: Not on file    Inability: Not on file  . Transportation needs:    Medical: Not on file    Non-medical: Not on file  Tobacco Use  . Smoking status: Never Smoker  . Smokeless tobacco: Never Used  Substance and Sexual Activity  . Alcohol use: Never    Frequency: Never  . Drug use: Never  . Sexual activity: Not on file  Lifestyle  . Physical activity:    Days per week: Not on file    Minutes per session: Not on file  . Stress: Not on file  Relationships  . Social connections:    Talks on phone: Not on file    Gets together: Not on file    Attends religious service: Not on file    Active member of club or organization: Not on file    Attends meetings of clubs or organizations: Not on file    Relationship status: Not on file  . Intimate partner violence:    Fear of current or ex partner: Not on file    Emotionally abused: Not on file    Physically abused: Not on file    Forced sexual activity: Not on file  Other Topics Concern  . Not on file  Social History Narrative   She lives at home with her husband Caroline Pratt   Left handed   Caffeine: 3 cups daily     PHYSICAL EXAM  GENERAL EXAM/CONSTITUTIONAL: Vitals:  Vitals:   11/11/17 0819  BP: 118/65  Pulse: 70  Weight: 133 lb (60.3 kg)  Height: '5\' 5"'$  (1.651 m)     Body mass index is 22.13 kg/m.  No exam data present  Patient is in no distress; well developed, nourished and groomed; neck is supple  CARDIOVASCULAR:  Examination of carotid arteries is normal; no carotid bruits  Regular rate and rhythm, no murmurs  Examination of peripheral vascular system by observation and  palpation is normal  EYES:  Ophthalmoscopic exam of optic discs and posterior segments is normal; no papilledema or hemorrhages  MUSCULOSKELETAL:  Gait, strength, tone, movements noted in Neurologic exam below  NEUROLOGIC: MENTAL STATUS:  No flowsheet data found.  awake, alert, oriented to person, place and time  recent and remote memory intact  normal attention and concentration  language fluent, comprehension intact, naming intact,  fund of knowledge appropriate  CRANIAL NERVE:   2nd - no papilledema on fundoscopic exam  2nd, 3rd, 4th, 6th - pupils equal and reactive to light, visual fields full to confrontation, extraocular muscles intact, no nystagmus  5th - facial sensation symmetric  7th - facial strength symmetric  8th - hearing intact  9th - palate elevates symmetrically, uvula midline  11th - shoulder shrug symmetric  12th - tongue protrusion midline  MOTOR:   normal bulk and tone, full strength in the BUE, BLE  SENSORY:   normal and symmetric to light touch, temperature, vibration  SLIGHT HYPERSENSITIVITY TO PP IN FEET  COORDINATION:   finger-nose-finger, fine finger movements normal  REFLEXES:   deep tendon reflexes TRACE and symmetric; ABSENT AT ANKLES  GAIT/STATION:   narrow based gait; romberg is negative    DIAGNOSTIC DATA (LABS, IMAGING, TESTING) - I reviewed patient records, labs, notes, testing and imaging myself where available.  Lab Results  Component Value Date   WBC 5.9 03/25/2011   HGB 13.5 03/25/2011   HCT 37.9 03/25/2011   MCV 87.7 03/25/2011   PLT 248 03/25/2011      Component Value Date/Time   NA 132 (L) 03/26/2008 0500   K 3.6 03/26/2008 0500   CL 103 03/26/2008 0500   CO2 24 03/26/2008 0500   GLUCOSE 147 (H) 03/26/2008 0500   BUN 8 03/26/2008 0500   CREATININE 0.57 03/26/2008 0500   CALCIUM 8.3 (L) 03/26/2008 0500   GFRNONAA >60 03/26/2008 0500   GFRAA  03/26/2008 0500    >60        The eGFR has  been calculated using the MDRD equation. This calculation has not been validated in all clinical   No results found for: CHOL, HDL, LDLCALC, LDLDIRECT, TRIG, CHOLHDL No results found for: HGBA1C No results found for: VITAMINB12 No results found for: TSH   10/21/17 MRI lumbar spine [I reviewed images myself and agree with interpretation. -VRP]  1. Mild multilevel degenerative changes of the lumbar spine as described above, slightly progressed when compared to prior study. Unchanged mild left neuroforaminal stenosis at L3-L4 and L4-L5. 2. No significant spinal canal stenosis at any level.     ASSESSMENT AND PLAN  77 y.o. year old female here with history of right knee injury, status post knee procedure and surgery, now with bilateral low back, hip, leg pain, right leg pain, bilateral hand numbness, bilateral foot numbness.  Patient symptoms are likely related to 2 categories, including muscular skeletal etiology and neuropathic etiology.   Dx: diabetic nerve pain (bilateral symm) + right knee pain (musculoskeletal, biomechanical imbalance) + low back pain (degenerative spine dz, musculoskeletal)  1. Diabetic polyneuropathy associated with type 2 diabetes mellitus ([place])   2. Lumbar degenerative disc disease      PLAN:  MUSCULOSKELETAL PAIN (right knee, bilateral hips, low back) - continue PT exercises - may use tylenol, ibuprofen, naproxen as needed  DIABETIC NERVE PAIN (?small fiber neuropathy; affecting toes and fingers) - likely has mild diabetic neuropathy (diagnosed clinically; EMG/NCS not necessary in my opinion) - continue lyrica (per Dr. Forde Dandy; may increase dose or switch to gabapentin or duloxetine)  Return for return to PCP and ortho clinic.    Penni Bombard, MD 3/84/5364, 6:80 AM Certified in Neurology, Neurophysiology and Neuroimaging  [facility] [address], [place], [place] [address] [phone]

## 2018-01-02 ENCOUNTER — Ambulatory Visit: Payer: Medicare Other | Admitting: Neurology

## 2018-01-02 ENCOUNTER — Encounter

## 2018-03-03 DIAGNOSIS — D229 Melanocytic nevi, unspecified: Secondary | ICD-10-CM | POA: Insufficient documentation

## 2018-06-15 DIAGNOSIS — M810 Age-related osteoporosis without current pathological fracture: Secondary | ICD-10-CM | POA: Insufficient documentation

## 2018-07-10 ENCOUNTER — Other Ambulatory Visit: Payer: Self-pay | Admitting: Endocrinology

## 2018-07-10 DIAGNOSIS — N644 Mastodynia: Secondary | ICD-10-CM

## 2018-07-10 DIAGNOSIS — R0781 Pleurodynia: Secondary | ICD-10-CM

## 2018-07-12 ENCOUNTER — Ambulatory Visit
Admission: RE | Admit: 2018-07-12 | Discharge: 2018-07-12 | Disposition: A | Discharge status: Home / Self Care | Payer: Medicare Other | Source: Ambulatory Visit | Attending: Endocrinology | Admitting: Endocrinology

## 2018-07-12 DIAGNOSIS — R0781 Pleurodynia: Secondary | ICD-10-CM

## 2018-07-12 DIAGNOSIS — N644 Mastodynia: Secondary | ICD-10-CM

## 2018-11-30 ENCOUNTER — Other Ambulatory Visit: Payer: Self-pay

## 2018-11-30 ENCOUNTER — Other Ambulatory Visit (HOSPITAL_COMMUNITY): Payer: Self-pay | Admitting: *Deleted

## 2018-12-01 ENCOUNTER — Ambulatory Visit (HOSPITAL_COMMUNITY)
Admission: RE | Admit: 2018-12-01 | Discharge: 2018-12-01 | Disposition: A | Discharge status: Home / Self Care | Payer: Medicare Other | Source: Ambulatory Visit | Attending: Endocrinology | Admitting: Endocrinology

## 2018-12-01 DIAGNOSIS — M810 Age-related osteoporosis without current pathological fracture: Secondary | ICD-10-CM | POA: Insufficient documentation

## 2018-12-01 MED ORDER — DENOSUMAB 60 MG/ML ~~LOC~~ SOSY
PREFILLED_SYRINGE | SUBCUTANEOUS | Status: AC
  Administered 2018-12-01: 60 mg via SUBCUTANEOUS
  Filled 2018-12-01: qty 1

## 2018-12-01 MED ORDER — DENOSUMAB 60 MG/ML ~~LOC~~ SOSY
60.0000 mg | PREFILLED_SYRINGE | Freq: Once | SUBCUTANEOUS | Status: AC
  Administered 2018-12-01: 60 mg via SUBCUTANEOUS

## 2018-12-01 NOTE — Discharge Instructions (Signed)
Denosumab injection °What is this medicine? °DENOSUMAB (den oh sue mab) slows bone breakdown. Prolia is used to treat osteoporosis in women after menopause and in men, and in people who are taking corticosteroids for 6 months or more. Xgeva is used to treat a high calcium level due to cancer and to prevent bone fractures and other bone problems caused by multiple myeloma or cancer bone metastases. Xgeva is also used to treat giant cell tumor of the bone. °This medicine may be used for other purposes; ask your health care provider or pharmacist if you have questions. °COMMON BRAND NAME(S): Prolia, XGEVA °What should I tell my health care provider before I take this medicine? °They need to know if you have any of these conditions: °-dental disease °-having surgery or tooth extraction °-infection °-kidney disease °-low levels of calcium or Vitamin D in the blood °-malnutrition °-on hemodialysis °-skin conditions or sensitivity °-thyroid or parathyroid disease °-an unusual reaction to denosumab, other medicines, foods, dyes, or preservatives °-pregnant or trying to get pregnant °-breast-feeding °How should I use this medicine? °This medicine is for injection under the skin. It is given by a health care professional in a hospital or clinic setting. °A special MedGuide will be given to you before each treatment. Be sure to read this information carefully each time. °For Prolia, talk to your pediatrician regarding the use of this medicine in children. Special care may be needed. For Xgeva, talk to your pediatrician regarding the use of this medicine in children. While this drug may be prescribed for children as young as 13 years for selected conditions, precautions do apply. °Overdosage: If you think you have taken too much of this medicine contact a poison control center or emergency room at once. °NOTE: This medicine is only for you. Do not share this medicine with others. °What if I miss a dose? °It is important not to  miss your dose. Call your doctor or health care professional if you are unable to keep an appointment. °What may interact with this medicine? °Do not take this medicine with any of the following medications: °-other medicines containing denosumab °This medicine may also interact with the following medications: °-medicines that lower your chance of fighting infection °-steroid medicines like prednisone or cortisone °This list may not describe all possible interactions. Give your health care provider a list of all the medicines, herbs, non-prescription drugs, or dietary supplements you use. Also tell them if you smoke, drink alcohol, or use illegal drugs. Some items may interact with your medicine. °What should I watch for while using this medicine? °Visit your doctor or health care professional for regular checks on your progress. Your doctor or health care professional may order blood tests and other tests to see how you are doing. °Call your doctor or health care professional for advice if you get a fever, chills or sore throat, or other symptoms of a cold or flu. Do not treat yourself. This drug may decrease your body's ability to fight infection. Try to avoid being around people who are sick. °You should make sure you get enough calcium and vitamin D while you are taking this medicine, unless your doctor tells you not to. Discuss the foods you eat and the vitamins you take with your health care professional. °See your dentist regularly. Brush and floss your teeth as directed. Before you have any dental work done, tell your dentist you are receiving this medicine. °Do not become pregnant while taking this medicine or for 5 months   after stopping it. Talk with your doctor or health care professional about your birth control options while taking this medicine. Women should inform their doctor if they wish to become pregnant or think they might be pregnant. There is a potential for serious side effects to an unborn  child. Talk to your health care professional or pharmacist for more information. °What side effects may I notice from receiving this medicine? °Side effects that you should report to your doctor or health care professional as soon as possible: °-allergic reactions like skin rash, itching or hives, swelling of the face, lips, or tongue °-bone pain °-breathing problems °-dizziness °-jaw pain, especially after dental work °-redness, blistering, peeling of the skin °-signs and symptoms of infection like fever or chills; cough; sore throat; pain or trouble passing urine °-signs of low calcium like fast heartbeat, muscle cramps or muscle pain; pain, tingling, numbness in the hands or feet; seizures °-unusual bleeding or bruising °-unusually weak or tired °Side effects that usually do not require medical attention (report to your doctor or health care professional if they continue or are bothersome): °-constipation °-diarrhea °-headache °-joint pain °-loss of appetite °-muscle pain °-runny nose °-tiredness °-upset stomach °This list may not describe all possible side effects. Call your doctor for medical advice about side effects. You may report side effects to FDA at 1-800-FDA-1088. °Where should I keep my medicine? °This medicine is only given in a clinic, doctor's office, or other health care setting and will not be stored at home. °NOTE: This sheet is a summary. It may not cover all possible information. If you have questions about this medicine, talk to your doctor, pharmacist, or health care provider. °© 2019 Elsevier/Gold Standard (2017-10-28 16:10:44) ° °

## 2018-12-25 ENCOUNTER — Other Ambulatory Visit: Payer: Self-pay | Admitting: Endocrinology

## 2018-12-25 DIAGNOSIS — R911 Solitary pulmonary nodule: Secondary | ICD-10-CM

## 2019-01-03 ENCOUNTER — Inpatient Hospital Stay: Admission: RE | Admit: 2019-01-03 | Payer: Medicare Other | Source: Ambulatory Visit

## 2019-01-03 ENCOUNTER — Other Ambulatory Visit: Payer: Self-pay

## 2019-01-03 ENCOUNTER — Ambulatory Visit
Admission: RE | Admit: 2019-01-03 | Discharge: 2019-01-03 | Disposition: A | Discharge status: Home / Self Care | Payer: Medicare Other | Source: Ambulatory Visit | Attending: Endocrinology | Admitting: Endocrinology

## 2019-01-03 DIAGNOSIS — R911 Solitary pulmonary nodule: Secondary | ICD-10-CM

## 2019-01-10 DIAGNOSIS — E1042 Type 1 diabetes mellitus with diabetic polyneuropathy: Secondary | ICD-10-CM | POA: Insufficient documentation

## 2019-03-01 ENCOUNTER — Other Ambulatory Visit: Payer: Self-pay | Admitting: Specialist

## 2019-03-01 ENCOUNTER — Telehealth: Payer: Self-pay | Admitting: Nurse Practitioner

## 2019-03-01 ENCOUNTER — Other Ambulatory Visit: Payer: Self-pay | Admitting: Surgical

## 2019-03-01 DIAGNOSIS — M545 Low back pain, unspecified: Secondary | ICD-10-CM

## 2019-03-01 DIAGNOSIS — G8929 Other chronic pain: Secondary | ICD-10-CM

## 2019-03-01 NOTE — Telephone Encounter (Signed)
Phone call to patient to verify medication list and allergies for myelogram procedure. Pt aware she will not need to hold any medications for this procedure. Pre and post procedure instructions reviewed with pt. 

## 2019-03-07 NOTE — Discharge Instructions (Signed)

## 2019-03-08 ENCOUNTER — Other Ambulatory Visit: Payer: Self-pay

## 2019-03-08 ENCOUNTER — Ambulatory Visit
Admission: RE | Admit: 2019-03-08 | Discharge: 2019-03-08 | Disposition: A | Discharge status: Home / Self Care | Payer: Medicare Other | Source: Ambulatory Visit | Attending: Surgical | Admitting: Surgical

## 2019-03-08 DIAGNOSIS — G8929 Other chronic pain: Secondary | ICD-10-CM

## 2019-03-08 DIAGNOSIS — M545 Low back pain, unspecified: Secondary | ICD-10-CM

## 2019-03-08 MED ORDER — IOPAMIDOL (ISOVUE-M 200) INJECTION 41%
20.0000 mL | Freq: Once | INTRAMUSCULAR | Status: AC
  Administered 2019-03-08: 20 mL via INTRATHECAL

## 2019-03-08 MED ORDER — DIAZEPAM 5 MG PO TABS
5.0000 mg | ORAL_TABLET | Freq: Once | ORAL | Status: AC
  Administered 2019-03-08: 5 mg via ORAL

## 2019-03-15 DIAGNOSIS — M810 Age-related osteoporosis without current pathological fracture: Secondary | ICD-10-CM | POA: Insufficient documentation

## 2019-03-15 DIAGNOSIS — M5136 Other intervertebral disc degeneration, lumbar region: Secondary | ICD-10-CM | POA: Insufficient documentation

## 2019-03-29 ENCOUNTER — Encounter (HOSPITAL_COMMUNITY): Payer: Medicare Other

## 2019-03-30 ENCOUNTER — Other Ambulatory Visit: Payer: Self-pay | Admitting: Orthopedic Surgery

## 2019-03-30 ENCOUNTER — Other Ambulatory Visit (HOSPITAL_COMMUNITY): Payer: Self-pay | Admitting: Orthopedic Surgery

## 2019-03-30 DIAGNOSIS — Z96651 Presence of right artificial knee joint: Secondary | ICD-10-CM

## 2019-04-05 ENCOUNTER — Encounter (HOSPITAL_COMMUNITY)
Admission: RE | Admit: 2019-04-05 | Discharge: 2019-04-05 | Disposition: A | Discharge status: Home / Self Care | Payer: Medicare Other | Source: Ambulatory Visit | Attending: Orthopedic Surgery | Admitting: Orthopedic Surgery

## 2019-04-05 ENCOUNTER — Other Ambulatory Visit: Payer: Self-pay

## 2019-04-05 ENCOUNTER — Encounter (HOSPITAL_COMMUNITY): Payer: Self-pay

## 2019-04-05 DIAGNOSIS — Z96651 Presence of right artificial knee joint: Secondary | ICD-10-CM | POA: Insufficient documentation

## 2019-04-05 MED ORDER — TECHNETIUM TC 99M MEDRONATE IV KIT
20.0000 | PACK | Freq: Once | INTRAVENOUS | Status: AC | PRN
  Administered 2019-04-05: 19 via INTRAVENOUS

## 2019-04-16 DIAGNOSIS — I700 Atherosclerosis of aorta: Secondary | ICD-10-CM | POA: Insufficient documentation

## 2019-05-07 DIAGNOSIS — M25369 Other instability, unspecified knee: Secondary | ICD-10-CM | POA: Insufficient documentation

## 2019-05-16 NOTE — H&P (Signed)
TOTAL KNEE REVISION ADMISSION H&P  Patient is being admitted for right revision total knee arthroplasty.  Subjective:  Chief Complaint:right knee pain.  HPI: Caroline Pratt, 78 y.o. female, has a history of pain and functional disability in the right knee(s) due to failed previous arthroplasty and patient has failed non-surgical conservative treatments for greater than 12 weeks to include NSAID's and/or analgesics and activity modification. The indications for the revision of the total knee arthroplasty are unstable right total knee. She had a previous right total knee by Dr. Mayer Camel in 2009. She states she has had persistent pain and instability in the knee since surgery. Patient currently rates pain in the right knee(s) at 7 out of 10 with activity. There is worsening of pain with activity and weight bearing and pain that interferes with activities of daily living.  Patient has evidence of demonstrate a [employer] prosthesis that appears to be in good alignment. There is no definitive loosening on the plain film. by imaging studies. This condition presents safety issues increasing the risk of falls.  There is no current active infection.  There are no active problems to display for this patient.  Past Medical History:  Diagnosis Date  . Breast cancer ([place])   . Diabetes ([place])   . High cholesterol   . Osteoarthritis     Past Surgical History:  Procedure Laterality Date  . EYE SURGERY    . MASTECTOMY Left   . REPLACEMENT TOTAL KNEE Right     No current facility-administered medications for this encounter.    Current Outpatient Medications  Medication Sig Dispense Refill Last Dose  . Acetaminophen (TYLENOL 8 HOUR ARTHRITIS PAIN PO) Take 1 tablet by mouth at bedtime.   Taking  . aspirin (BAYER ASPIRIN EC LOW DOSE) 81 MG EC tablet Take 81 mg by mouth daily. Swallow whole.   Taking  . atorvastatin (LIPITOR) 10 MG tablet Take 5 mg by mouth daily.    Taking  .  BIOTIN PO Take 500 mg by mouth daily.   Not Taking  . Calcium Carb-Cholecalciferol (CALCIUM 600/VITAMIN D3 PO) Take 1 tablet by mouth daily.   Not Taking  . empagliflozin (JARDIANCE) 25 MG TABS tablet Take 25 mg by mouth daily.   Taking  . Insulin Glargine (LANTUS SOLOSTAR) 100 UNIT/ML Solostar Pen Inject 16 Units into the skin daily.    Taking  . insulin lispro (HUMALOG KWIKPEN) 100 UNIT/ML KiwkPen Inject into the skin 3 (three) times daily. 2 units in the morning, 4 units at lunch, and 6 units at dinner   Taking  . Multiple Vitamins-Minerals (OCUVITE ADULT 50+ PO) Take 1 tablet by mouth daily.   Taking  . OVER THE COUNTER MEDICATION Take 970 mg by mouth. Diaplex 1 tablet in AM and 2 tablets at bedtime   Taking  . OVER THE COUNTER MEDICATION Take 1 tablet by mouth daily. Lion's Mane   Taking  . pregabalin (LYRICA) 50 MG capsule Take 50 mg by mouth at bedtime.    Taking   No Known Allergies  Social History   Tobacco Use  . Smoking status: Never Smoker  . Smokeless tobacco: Never Used  Substance Use Topics  . Alcohol use: Never    Frequency: Never    Family History  Problem Relation Age of Onset  . Stroke Mother   . Heart attack Father       Review of Systems  Constitutional: Negative for chills and fever.  Respiratory: Negative  for cough and shortness of breath.   Cardiovascular: Negative for chest pain and palpitations.  Gastrointestinal: Negative for nausea and vomiting.  Musculoskeletal: Positive for joint pain.     Objective:  Physical Exam Patient is a 78 year old female.  Well nourished and well developed. General: Alert and oriented x3, cooperative and pleasant, no acute distress. Head: normocephalic, atraumatic, neck supple. Eyes: EOMI. Respiratory: breath sounds clear in all fields, no wheezing, rales, or rhonchi. Cardiovascular: Regular rate and rhythm, no murmurs, gallops or rubs. Abdomen: non-tender to palpation and soft, normoactive bowel sounds.   Musculoskeletal: Right Hip Exam: ROM: Normal without discomfort. There is no tenderness over the greater trochanter.  Right Knee Exam: No significant effusion. Mild swelling, especially in the soft tissue. Range of motion is 0-130 degrees. No crepitus on range of motion of the knee. No medial or lateral joint line tenderness. Significant varus/valgus laxity in extension and very significant AP laxity in flexion.  Calves soft and nontender. Motor function intact in LE. Strength 5/5 LE bilaterally. Neuro: Distal pulses 2+. Sensation to light touch intact in LE.  Vital signs in last 24 hours: Labs:  Estimated body mass index is 22.13 kg/m as calculated from the following:   Height as of 11/11/17: 5\' 5"  (1.651 m).   Weight as of 11/11/17: 60.3 kg.  Imaging Review Plain radiographs demonstrate a [employer] Sigma rotating platform prosthesis that appears to be in good alignment. There is no definitive loosening on the plain film.  Assessment/Plan:   right knee(s) with failed previous arthroplasty.   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 Revision total knee arthroplasty is deemed medically necessary. The treatment options including medical management, injection therapy, arthroscopy and revision arthroplasty were discussed at length. The risks and benefits of revision total knee arthroplasty were presented and reviewed. The risks due to aseptic loosening, infection, stiffness, patella tracking problems, thromboembolic complications and other imponderables were discussed. The patient acknowledged the explanation, agreed to proceed with the plan and consent was signed. Patient is being admitted for inpatient treatment for surgery, pain control, PT, OT, prophylactic antibiotics, VTE prophylaxis, progressive ambulation and  ADL's and discharge planning.The patient is planning to be discharged home.   Therapy Plans: Therapy pending poly exchange vs TKA revision, HHPT vs OPPT at [facility] Disposition: Home with Planned DVT Prophylaxis: Xarelto 10mg  daily (remote hx of breast cancer) DME needed: walker, 3-n-1 PCP: Dr. Reynold Bowen, clearance received TXA: IV Allergies: NKDA Anesthesia Concerns: none BMI: 20.3 Last HgbA1c: 7.2%  Other: Hx of breast cancer in 1990, s/p mastectomy.  NO IV STICKS OR BP ON THE LEFT ARM.  - Patient was instructed on what medications to stop prior to surgery. - Follow-up visit in 2 weeks with Dr. Wynelle Link - Begin physical therapy following surgery - Pre-operative lab work as pre-surgical testing - Prescriptions will be provided in hospital at time of discharge  Griffith Citron, PA-C Orthopedic Surgery [facility] [place] [phone]

## 2019-05-22 ENCOUNTER — Other Ambulatory Visit (HOSPITAL_COMMUNITY): Payer: Self-pay | Admitting: *Deleted

## 2019-05-23 ENCOUNTER — Other Ambulatory Visit: Payer: Self-pay

## 2019-05-23 ENCOUNTER — Ambulatory Visit (HOSPITAL_COMMUNITY)
Admission: RE | Admit: 2019-05-23 | Discharge: 2019-05-23 | Disposition: A | Discharge status: Home / Self Care | Payer: Medicare Other | Source: Ambulatory Visit | Attending: Endocrinology | Admitting: Endocrinology

## 2019-05-23 DIAGNOSIS — M810 Age-related osteoporosis without current pathological fracture: Secondary | ICD-10-CM | POA: Diagnosis present

## 2019-05-23 MED ORDER — DENOSUMAB 60 MG/ML ~~LOC~~ SOSY
PREFILLED_SYRINGE | SUBCUTANEOUS | Status: AC
  Filled 2019-05-23: qty 1

## 2019-05-23 MED ORDER — DENOSUMAB 60 MG/ML ~~LOC~~ SOSY
60.0000 mg | PREFILLED_SYRINGE | Freq: Once | SUBCUTANEOUS | Status: AC
  Administered 2019-05-23: 60 mg via SUBCUTANEOUS

## 2019-05-23 NOTE — Patient Instructions (Addendum)
DUE TO COVID-19 ONLY ONE VISITOR IS ALLOWED TO COME WITH YOU AND STAY IN THE WAITING ROOM ONLY DURING PRE OP AND PROCEDURE DAY OF SURGERY. THE 1 VISITOR MAY VISIT WITH YOU AFTER SURGERY IN YOUR PRIVATE ROOM DURING VISITING HOURS ONLY!  YOU NEED TO HAVE A COVID 19 TEST ON___11-19____ @___11 :30AM____, THIS TEST MUST BE DONE BEFORE SURGERY, COME  [address], [place] , [address].  ([place]) ONCE YOUR COVID TEST IS COMPLETED, PLEASE BEGIN THE QUARANTINE INSTRUCTIONS AS OUTLINED IN YOUR HANDOUT.                 Caroline Pratt   Your procedure is scheduled on: 11-23   Report to [facility] Main  Entrance   Report to admitting at 11:45AM     Call this number if you have problems the morning of surgery [phone]    NO SOLID FOOD AFTER MIDNIGHT THE NIGHT PRIOR TO SURGERY. YOU MAY DRINK CLEAR LIQUIDS.   STOP CLEAR LIQUIDS AT ___12:15PM______ AND THEN DRINK THE GATORADE PRE-SURGERY [place]. NOTHING BY MOUTH AFTER THE GATORADE DRINK!    CLEAR LIQUID DIET   Foods Allowed                                                                     Foods Excluded  Coffee and tea, regular and decaf                             liquids that you cannot  Plain Jell-O any favor except red or purple                                           see through such as: Fruit ices (not with fruit pulp)                                     milk, soups, orange juice  Iced Popsicles                                    All solid food Carbonated beverages, regular and diet                                    Cranberry, grape and apple juices Sports drinks like Gatorade Lightly seasoned clear broth or consume(fat free) Sugar, honey syrup  Sample Menu Breakfast                                Lunch                                     Supper Cranberry juice  Beef broth                            Chicken broth Jell-O                                     Grape juice                            Apple juice Coffee or tea                        Jell-O                                      Popsicle                                                Coffee or tea                        Coffee or tea  _____________________________________________________________________   BRUSH YOUR TEETH MORNING OF SURGERY AND RINSE YOUR MOUTH OUT, NO CHEWING GUM CANDY OR MINTS.     Take these medicines the morning of surgery with A SIP OF WATER: ATORVASTATIN    How to Manage Your Diabetes Before and After Surgery  Why is it important to control my blood sugar before and after surgery? . Improving blood sugar levels before and after surgery helps healing and can limit problems. . A way of improving blood sugar control is eating a healthy diet by: o  Eating less sugar and carbohydrates o  Increasing activity/exercise o  Talking with your doctor about reaching your blood sugar goals . High blood sugars (greater than 180 mg/dL) can raise your risk of infections and slow your recovery, so you will need to focus on controlling your diabetes during the weeks before surgery. . Make sure that the doctor who takes care of your diabetes knows about your planned surgery including the date and location.  How do I manage my blood sugar before surgery? . Check your blood sugar at least 4 times a day, starting 2 days before surgery, to make sure that the level is not too high or low. o Check your blood sugar the morning of your surgery when you wake up and every 2 hours until you get to the Short Stay unit. . If your blood sugar is less than 70 mg/dL, you will need to treat for low blood sugar: o Do not take insulin. o Treat a low blood sugar (less than 70 mg/dL) with  cup of clear juice (cranberry or apple), 4 glucose tablets, OR glucose gel. o Recheck blood sugar in 15 minutes after treatment (to make sure it is greater than 70 mg/dL). If your blood sugar is not greater than 70 mg/dL on recheck, call  [phone] for further instructions. . Report your blood sugar to the short stay nurse when you get to Short Stay.  . If you are admitted to the hospital after surgery: o Your blood sugar will be checked by the staff and you will  probably be given insulin after surgery (instead of oral diabetes medicines) to make sure you have good blood sugar levels. o The goal for blood sugar control after surgery is 80-180 mg/dL.   WHAT DO I DO ABOUT MY DIABETES MEDICATION?   . THE DAY BEFORE SURGERY 11-22 o JARDIANCE: do not take    o LANTUS INSULIN: take as normal in the morning  o HUMALOG INSULIN: do not take bedtime dose       . THE MORNING OF SURGERY 11-23 o JARDIANCE : do not take  o LANTUS INSULIN: take half of usual dose o HUMALOG INSULIN: only take if blood sugar is greater than 220 the morning of surgery . If greater than 220 , you may take half of the usual dose   Reviewed and Endorsed by [facility] Patient Education Committee, August 2015                               You may not have any metal on your body including hair pins and              piercings  Do not wear jewelry, make-up, lotions, powders or perfumes, deodorant             Do not wear nail polish on your fingernails.  Do not shave  48 hours prior to surgery.              Men may shave face and neck.   Do not bring valuables to the hospital. [place].  Contacts, dentures or bridgework may not be worn into surgery.  YOU MAY BRING A SMALL OVERNIGHT BAG                Please read over the following fact sheets you were given: _____________________________________________________________________             [facility] - Preparing for Surgery Before surgery, you can play an important role.  Because skin is not sterile, your skin needs to be as free of germs as possible.  You can reduce the number of germs on your skin by washing with CHG (chlorahexidine gluconate)  soap before surgery.  CHG is an antiseptic cleaner which kills germs and bonds with the skin to continue killing germs even after washing. Please DO NOT use if you have an allergy to CHG or antibacterial soaps.  If your skin becomes reddened/irritated stop using the CHG and inform your nurse when you arrive at Short Stay. Do not shave (including legs and underarms) for at least 48 hours prior to the first CHG shower.  You may shave your face/neck. Please follow these instructions carefully:  1.  Shower with CHG Soap the night before surgery and the  morning of Surgery.  2.  If you choose to wash your hair, wash your hair first as usual with your  normal  shampoo.  3.  After you shampoo, rinse your hair and body thoroughly to remove the  shampoo.                           4.  Use CHG as you would any other liquid soap.  You can apply chg directly  to the skin and wash  Gently with a scrungie or clean washcloth.  5.  Apply the CHG Soap to your body ONLY FROM THE NECK DOWN.   Do not use on face/ open                           Wound or open sores. Avoid contact with eyes, ears mouth and genitals (private parts).                       Wash face,  Genitals (private parts) with your normal soap.             6.  Wash thoroughly, paying special attention to the area where your surgery  will be performed.  7.  Thoroughly rinse your body with warm water from the neck down.  8.  DO NOT shower/wash with your normal soap after using and rinsing off  the CHG Soap.                9.  Pat yourself dry with a clean towel.            10.  Wear clean pajamas.            11.  Place clean sheets on your bed the night of your first shower and do not  sleep with pets. Day of Surgery : Do not apply any lotions/deodorants the morning of surgery.  Please wear clean clothes to the hospital/surgery center.  FAILURE TO FOLLOW THESE INSTRUCTIONS MAY RESULT IN THE CANCELLATION OF YOUR SURGERY PATIENT  SIGNATURE_________________________________  NURSE SIGNATURE__________________________________  ________________________________________________________________________   Adam Phenix  An incentive spirometer is a tool that can help keep your lungs clear and active. This tool measures how well you are filling your lungs with each breath. Taking long deep breaths may help reverse or decrease the chance of developing breathing (pulmonary) problems (especially infection) following:  A long period of time when you are unable to move or be active. BEFORE THE PROCEDURE   If the spirometer includes an indicator to show your best effort, your nurse or respiratory therapist will set it to a desired goal.  If possible, sit up straight or lean slightly forward. Try not to slouch.  Hold the incentive spirometer in an upright position. INSTRUCTIONS FOR USE  1. Sit on the edge of your bed if possible, or sit up as far as you can in bed or on a chair. 2. Hold the incentive spirometer in an upright position. 3. Breathe out normally. 4. Place the mouthpiece in your mouth and seal your lips tightly around it. 5. Breathe in slowly and as deeply as possible, raising the piston or the ball toward the top of the column. 6. Hold your breath for 3-5 seconds or for as long as possible. Allow the piston or ball to fall to the bottom of the column. 7. Remove the mouthpiece from your mouth and breathe out normally. 8. Rest for a few seconds and repeat Steps 1 through 7 at least 10 times every 1-2 hours when you are awake. Take your time and take a few normal breaths between deep breaths. 9. The spirometer may include an indicator to show your best effort. Use the indicator as a goal to work toward during each repetition. 10. After each set of 10 deep breaths, practice coughing to be sure your lungs are clear. If you have an incision (the cut made at the time of surgery),  support your incision when coughing  by placing a pillow or rolled up towels firmly against it. Once you are able to get out of bed, walk around indoors and cough well. You may stop using the incentive spirometer when instructed by your caregiver.  RISKS AND COMPLICATIONS  Take your time so you do not get dizzy or light-headed.  If you are in pain, you may need to take or ask for pain medication before doing incentive spirometry. It is harder to take a deep breath if you are having pain. AFTER USE  Rest and breathe slowly and easily.  It can be helpful to keep track of a log of your progress. Your caregiver can provide you with a simple table to help with this. If you are using the spirometer at home, follow these instructions: [place] IF:   You are having difficultly using the spirometer.  You have trouble using the spirometer as often as instructed.  Your pain medication is not giving enough relief while using the spirometer.  You develop fever of 100.5 F (38.1 C) or higher. SEEK IMMEDIATE MEDICAL CARE IF:   You cough up bloody sputum that had not been present before.  You develop fever of 102 F (38.9 C) or greater.  You develop worsening pain at or near the incision site. MAKE SURE YOU:   Understand these instructions.  Will watch your condition.  Will get help right away if you are not doing well or get worse. Document Released: 11/01/2006 Document Revised: 09/13/2011 Document Reviewed: 01/02/2007 ExitCare Patient Information 2014 ExitCare, [place].   ________________________________________________________________________  WHAT IS A BLOOD TRANSFUSION? Blood Transfusion Information  A transfusion is the replacement of blood or some of its parts. Blood is made up of multiple cells which provide different functions.  Red blood cells carry oxygen and are used for blood loss replacement.  White blood cells fight against infection.  Platelets control bleeding.  Plasma helps clot  blood.  Other blood products are available for specialized needs, such as hemophilia or other clotting disorders. BEFORE THE TRANSFUSION  Who gives blood for transfusions?   Healthy volunteers who are fully evaluated to make sure their blood is safe. This is blood bank blood. Transfusion therapy is the safest it has ever been in the practice of medicine. Before blood is taken from a donor, a complete history is taken to make sure that person has no history of diseases nor engages in risky social behavior (examples are intravenous drug use or sexual activity with multiple partners). The donor's travel history is screened to minimize risk of transmitting infections, such as malaria. The donated blood is tested for signs of infectious diseases, such as HIV and hepatitis. The blood is then tested to be sure it is compatible with you in order to minimize the chance of a transfusion reaction. If you or a relative donates blood, this is often done in anticipation of surgery and is not appropriate for emergency situations. It takes many days to process the donated blood. RISKS AND COMPLICATIONS Although transfusion therapy is very safe and saves many lives, the main dangers of transfusion include:   Getting an infectious disease.  Developing a transfusion reaction. This is an allergic reaction to something in the blood you were given. Every precaution is taken to prevent this. The decision to have a blood transfusion has been considered carefully by your caregiver before blood is given. Blood is not given unless the benefits outweigh the risks. AFTER THE TRANSFUSION  Right after receiving a blood transfusion, you will usually feel much better and more energetic. This is especially true if your red blood cells have gotten low (anemic). The transfusion raises the level of the red blood cells which carry oxygen, and this usually causes an energy increase.  The nurse administering the transfusion will monitor  you carefully for complications. HOME CARE INSTRUCTIONS  No special instructions are needed after a transfusion. You may find your energy is better. Speak with your caregiver about any limitations on activity for underlying diseases you may have. SEEK MEDICAL CARE IF:   Your condition is not improving after your transfusion.  You develop redness or irritation at the intravenous (IV) site. SEEK IMMEDIATE MEDICAL CARE IF:  Any of the following symptoms occur over the next 12 hours:  Shaking chills.  You have a temperature by mouth above 102 F (38.9 C), not controlled by medicine.  Chest, back, or muscle pain.  People around you feel you are not acting correctly or are confused.  Shortness of breath or difficulty breathing.  Dizziness and fainting.  You get a rash or develop hives.  You have a decrease in urine output.  Your urine turns a dark color or changes to pink, red, or brown. Any of the following symptoms occur over the next 10 days:  You have a temperature by mouth above 102 F (38.9 C), not controlled by medicine.  Shortness of breath.  Weakness after normal activity.  The white part of the eye turns yellow (jaundice).  You have a decrease in the amount of urine or are urinating less often.  Your urine turns a dark color or changes to pink, red, or brown. Document Released: 06/18/2000 Document Revised: 09/13/2011 Document Reviewed: 02/05/2008 [facility] Patient Information 2014 [place], [place].  _______________________________________________________________________

## 2019-05-24 ENCOUNTER — Other Ambulatory Visit (HOSPITAL_COMMUNITY)
Admission: RE | Admit: 2019-05-24 | Discharge: 2019-05-24 | Disposition: A | Discharge status: Home / Self Care | Payer: Medicare Other | Source: Ambulatory Visit | Attending: Orthopedic Surgery | Admitting: Orthopedic Surgery

## 2019-05-24 ENCOUNTER — Encounter (HOSPITAL_COMMUNITY): Payer: Self-pay

## 2019-05-24 ENCOUNTER — Other Ambulatory Visit: Payer: Self-pay

## 2019-05-24 ENCOUNTER — Encounter (HOSPITAL_COMMUNITY)
Admission: RE | Admit: 2019-05-24 | Discharge: 2019-05-24 | Disposition: A | Discharge status: Home / Self Care | Payer: Medicare Other | Source: Ambulatory Visit | Attending: Orthopedic Surgery | Admitting: Orthopedic Surgery

## 2019-05-24 DIAGNOSIS — Z01818 Encounter for other preprocedural examination: Secondary | ICD-10-CM | POA: Insufficient documentation

## 2019-05-24 DIAGNOSIS — E119 Type 2 diabetes mellitus without complications: Secondary | ICD-10-CM | POA: Insufficient documentation

## 2019-05-24 DIAGNOSIS — Z794 Long term (current) use of insulin: Secondary | ICD-10-CM | POA: Insufficient documentation

## 2019-05-24 DIAGNOSIS — Z7982 Long term (current) use of aspirin: Secondary | ICD-10-CM | POA: Diagnosis not present

## 2019-05-24 DIAGNOSIS — Z20828 Contact with and (suspected) exposure to other viral communicable diseases: Secondary | ICD-10-CM | POA: Diagnosis not present

## 2019-05-24 DIAGNOSIS — Z79899 Other long term (current) drug therapy: Secondary | ICD-10-CM | POA: Insufficient documentation

## 2019-05-24 HISTORY — DX: Age-related osteoporosis without current pathological fracture: M81.0

## 2019-05-24 HISTORY — DX: Personal history of other infectious and parasitic diseases: Z86.19

## 2019-05-24 HISTORY — DX: Polyneuropathy, unspecified: G62.9

## 2019-05-24 LAB — COMPREHENSIVE METABOLIC PANEL
ALT: 21 U/L (ref 0–44)
AST: 25 U/L (ref 15–41)
Albumin: 3.3 g/dL — ABNORMAL LOW (ref 3.5–5.0)
Alkaline Phosphatase: 56 U/L (ref 38–126)
Anion gap: 8 (ref 5–15)
BUN: 15 mg/dL (ref 8–23)
CO2: 27 mmol/L (ref 22–32)
Calcium: 8.9 mg/dL (ref 8.9–10.3)
Chloride: 105 mmol/L (ref 98–111)
Creatinine, Ser: 0.67 mg/dL (ref 0.44–1.00)
GFR calc Af Amer: >60 mL/min (ref >60)
GFR calc non Af Amer: >60 mL/min (ref >60)
Glucose, Bld: 229 mg/dL — ABNORMAL HIGH (ref 70–99)
Potassium: 4.1 mmol/L (ref 3.5–5.1)
Sodium: 140 mmol/L (ref 135–145)
Total Bilirubin: 0.8 mg/dL (ref 0.3–1.2)
Total Protein: 5.7 g/dL — ABNORMAL LOW (ref 6.5–8.1)

## 2019-05-24 LAB — HEMOGLOBIN A1C
Hgb A1c MFr Bld: 7 % — ABNORMAL HIGH (ref 4.8–5.6)
Mean Plasma Glucose: 154.2 mg/dL

## 2019-05-24 LAB — CBC WITH DIFFERENTIAL/PLATELET
Abs Immature Granulocytes: 0.02 10*3/uL (ref 0.00–0.07)
Basophils Absolute: 0.1 10*3/uL (ref 0.0–0.1)
Basophils Relative: 1 %
Eosinophils Absolute: 0.2 10*3/uL (ref 0.0–0.5)
Eosinophils Relative: 3 %
HCT: 39.7 % (ref 36.0–46.0)
Hemoglobin: 12.8 g/dL (ref 12.0–15.0)
Immature Granulocytes: 0 %
Lymphocytes Relative: 16 %
Lymphs Abs: 1 10*3/uL (ref 0.7–4.0)
MCH: 30.8 pg (ref 26.0–34.0)
MCHC: 32.2 g/dL (ref 30.0–36.0)
MCV: 95.4 fL (ref 80.0–100.0)
Monocytes Absolute: 0.5 10*3/uL (ref 0.1–1.0)
Monocytes Relative: 7 %
Neutro Abs: 4.6 10*3/uL (ref 1.7–7.7)
Neutrophils Relative %: 73 %
Platelets: 228 10*3/uL (ref 150–400)
RBC: 4.16 MIL/uL (ref 3.87–5.11)
RDW: 12.6 % (ref 11.5–15.5)
WBC: 6.3 10*3/uL (ref 4.0–10.5)
nRBC: 0 % (ref 0.0–0.2)

## 2019-05-24 LAB — GLUCOSE, CAPILLARY: Glucose-Capillary: 194 mg/dL — ABNORMAL HIGH (ref 70–99)

## 2019-05-24 LAB — SURGICAL PCR SCREEN
MRSA, PCR: NEGATIVE
Staphylococcus aureus: NEGATIVE

## 2019-05-24 LAB — ABO/RH: ABO/RH(D): A POS

## 2019-05-24 LAB — PROTIME-INR
INR: 1 (ref 0.8–1.2)
Prothrombin Time: 13.4 seconds (ref 11.4–15.2)

## 2019-05-24 LAB — APTT: aPTT: 34 seconds (ref 24–36)

## 2019-05-24 NOTE — Progress Notes (Signed)
PCP - Reynold Bowen, MD Cardiologist -   Chest x-ray - ct chest 01-04-2019 epic  EKG - 11-19 epic  Stress Test -  ECHO -  Cardiac Cath -   Sleep Study -  CPAP -   Fasting Blood Sugar -  Checks Blood Sugar _____ times a day  Blood Thinner Instructions: Aspirin Instructions: hold x 1 week , Last Dose: 11-15  Anesthesia review:   Hx of htn , dm2 . EKG done today shown to PA  . PA to f/u with PCP  Patient denies shortness of breath, fever, cough and chest pain at PAT appointment   Patient verbalized understanding of instructions that were given to them at the PAT appointment. Patient was also instructed that they will need to review over the PAT instructions again at home before surgery.

## 2019-05-25 LAB — NOVEL CORONAVIRUS, NAA (HOSP ORDER, SEND-OUT TO REF LAB; TAT 18-24 HRS): SARS-CoV-2, NAA: NOT DETECTED

## 2019-05-27 MED ORDER — BUPIVACAINE LIPOSOME 1.3 % IJ SUSP
20.0000 mL | Freq: Once | INTRAMUSCULAR | Status: DC
  Filled 2019-05-27: qty 20

## 2019-05-28 ENCOUNTER — Other Ambulatory Visit: Payer: Self-pay

## 2019-05-28 ENCOUNTER — Inpatient Hospital Stay (HOSPITAL_COMMUNITY): Payer: Medicare Other | Admitting: Physician Assistant

## 2019-05-28 ENCOUNTER — Encounter (HOSPITAL_COMMUNITY): Payer: Self-pay | Admitting: *Deleted

## 2019-05-28 ENCOUNTER — Encounter (HOSPITAL_COMMUNITY)
Admission: RE | Disposition: A | Discharge status: Home / Self Care | Payer: Self-pay | Source: Home / Self Care | Attending: Orthopedic Surgery

## 2019-05-28 ENCOUNTER — Telehealth (HOSPITAL_COMMUNITY): Payer: Self-pay | Admitting: *Deleted

## 2019-05-28 ENCOUNTER — Inpatient Hospital Stay (HOSPITAL_COMMUNITY): Payer: Medicare Other | Admitting: Anesthesiology

## 2019-05-28 ENCOUNTER — Inpatient Hospital Stay (HOSPITAL_COMMUNITY)
Admission: RE | Admit: 2019-05-28 | Discharge: 2019-05-29 | DRG: 468 | Disposition: A | Discharge status: Home / Self Care | Payer: Medicare Other | Attending: Orthopedic Surgery | Admitting: Orthopedic Surgery

## 2019-05-28 DIAGNOSIS — Z823 Family history of stroke: Secondary | ICD-10-CM

## 2019-05-28 DIAGNOSIS — T84018A Broken internal joint prosthesis, other site, initial encounter: Secondary | ICD-10-CM

## 2019-05-28 DIAGNOSIS — Z8249 Family history of ischemic heart disease and other diseases of the circulatory system: Secondary | ICD-10-CM | POA: Diagnosis not present

## 2019-05-28 DIAGNOSIS — T84012A Broken internal right knee prosthesis, initial encounter: Secondary | ICD-10-CM

## 2019-05-28 DIAGNOSIS — T84022A Instability of internal right knee prosthesis, initial encounter: Secondary | ICD-10-CM | POA: Diagnosis present

## 2019-05-28 DIAGNOSIS — E119 Type 2 diabetes mellitus without complications: Secondary | ICD-10-CM | POA: Diagnosis present

## 2019-05-28 DIAGNOSIS — M810 Age-related osteoporosis without current pathological fracture: Secondary | ICD-10-CM | POA: Diagnosis present

## 2019-05-28 DIAGNOSIS — Z794 Long term (current) use of insulin: Secondary | ICD-10-CM | POA: Diagnosis not present

## 2019-05-28 DIAGNOSIS — Z7982 Long term (current) use of aspirin: Secondary | ICD-10-CM | POA: Diagnosis not present

## 2019-05-28 DIAGNOSIS — E7800 Pure hypercholesterolemia, unspecified: Secondary | ICD-10-CM | POA: Diagnosis present

## 2019-05-28 DIAGNOSIS — Z79899 Other long term (current) drug therapy: Secondary | ICD-10-CM

## 2019-05-28 DIAGNOSIS — T84092A Other mechanical complication of internal right knee prosthesis, initial encounter: Secondary | ICD-10-CM | POA: Diagnosis present

## 2019-05-28 DIAGNOSIS — Z9012 Acquired absence of left breast and nipple: Secondary | ICD-10-CM

## 2019-05-28 DIAGNOSIS — Y792 Prosthetic and other implants, materials and accessory orthopedic devices associated with adverse incidents: Secondary | ICD-10-CM | POA: Diagnosis present

## 2019-05-28 DIAGNOSIS — E1142 Type 2 diabetes mellitus with diabetic polyneuropathy: Secondary | ICD-10-CM | POA: Diagnosis present

## 2019-05-28 DIAGNOSIS — Z96659 Presence of unspecified artificial knee joint: Secondary | ICD-10-CM

## 2019-05-28 DIAGNOSIS — Z853 Personal history of malignant neoplasm of breast: Secondary | ICD-10-CM | POA: Diagnosis not present

## 2019-05-28 HISTORY — PX: TOTAL KNEE REVISION: SHX996

## 2019-05-28 LAB — GLUCOSE, CAPILLARY
Glucose-Capillary: 107 mg/dL — ABNORMAL HIGH (ref 70–99)
Glucose-Capillary: 70 mg/dL (ref 70–99)
Glucose-Capillary: 74 mg/dL (ref 70–99)
Glucose-Capillary: 110 mg/dL — ABNORMAL HIGH (ref 70–99)
Glucose-Capillary: 210 mg/dL — ABNORMAL HIGH (ref 70–99)

## 2019-05-28 LAB — TYPE AND SCREEN
ABO/RH(D): A POS
Antibody Screen: NEGATIVE

## 2019-05-28 SURGERY — TOTAL KNEE REVISION
Anesthesia: Spinal | Site: Knee | Laterality: Right

## 2019-05-28 MED ORDER — FENTANYL CITRATE (PF) 100 MCG/2ML IJ SOLN
INTRAMUSCULAR | Status: DC | PRN
  Administered 2019-05-28 (×3): 25 ug via INTRAVENOUS

## 2019-05-28 MED ORDER — CEFAZOLIN SODIUM-DEXTROSE 2-4 GM/100ML-% IV SOLN
2.0000 g | INTRAVENOUS | Status: AC
  Administered 2019-05-28: 2 g via INTRAVENOUS
  Filled 2019-05-28: qty 100

## 2019-05-28 MED ORDER — CANAGLIFLOZIN 100 MG PO TABS
100.0000 mg | ORAL_TABLET | Freq: Every day | ORAL | Status: DC
  Administered 2019-05-29: 100 mg via ORAL
  Filled 2019-05-28: qty 1

## 2019-05-28 MED ORDER — MIDAZOLAM HCL 2 MG/2ML IJ SOLN
1.0000 mg | Freq: Once | INTRAMUSCULAR | Status: AC
  Administered 2019-05-28: 12:00:00 2 mg via INTRAVENOUS

## 2019-05-28 MED ORDER — FENTANYL CITRATE (PF) 100 MCG/2ML IJ SOLN
50.0000 ug | INTRAMUSCULAR | Status: DC
  Administered 2019-05-28: 100 ug via INTRAVENOUS
  Filled 2019-05-28: qty 2

## 2019-05-28 MED ORDER — PREGABALIN 50 MG PO CAPS: 50.0000 mg | ORAL_CAPSULE | Freq: Every day | ORAL | Status: DC

## 2019-05-28 MED ORDER — LIDOCAINE HCL (CARDIAC) PF 100 MG/5ML IV SOSY
PREFILLED_SYRINGE | INTRAVENOUS | Status: DC | PRN
  Administered 2019-05-28: 40 mg via INTRAVENOUS

## 2019-05-28 MED ORDER — ATORVASTATIN CALCIUM 10 MG PO TABS
5.0000 mg | ORAL_TABLET | Freq: Every day | ORAL | Status: DC
  Administered 2019-05-29: 5 mg via ORAL
  Filled 2019-05-28: qty 1

## 2019-05-28 MED ORDER — GABAPENTIN 100 MG PO CAPS
200.0000 mg | ORAL_CAPSULE | Freq: Two times a day (BID) | ORAL | Status: DC
  Administered 2019-05-28 – 2019-05-29 (×2): 200 mg via ORAL
  Filled 2019-05-28 (×2): qty 2

## 2019-05-28 MED ORDER — SODIUM CHLORIDE 0.9 % IR SOLN
Status: DC | PRN
  Administered 2019-05-28: 1000 mL

## 2019-05-28 MED ORDER — FLEET ENEMA 7-19 GM/118ML RE ENEM: 1.0000 | ENEMA | Freq: Once | RECTAL | Status: DC | PRN

## 2019-05-28 MED ORDER — CELECOXIB 200 MG PO CAPS
400.0000 mg | ORAL_CAPSULE | Freq: Once | ORAL | Status: AC
  Administered 2019-05-28: 400 mg via ORAL
  Filled 2019-05-28: qty 2

## 2019-05-28 MED ORDER — METHOCARBAMOL 500 MG IVPB - SIMPLE MED
500.0000 mg | Freq: Four times a day (QID) | INTRAVENOUS | Status: DC | PRN
  Filled 2019-05-28: qty 50

## 2019-05-28 MED ORDER — ACETAMINOPHEN 10 MG/ML IV SOLN
1000.0000 mg | Freq: Four times a day (QID) | INTRAVENOUS | Status: DC
  Administered 2019-05-28: 1000 mg via INTRAVENOUS
  Filled 2019-05-28 (×2): qty 100

## 2019-05-28 MED ORDER — METOCLOPRAMIDE HCL 5 MG PO TABS: 5.0000 mg | ORAL_TABLET | Freq: Three times a day (TID) | ORAL | Status: DC | PRN

## 2019-05-28 MED ORDER — MIDAZOLAM HCL 2 MG/2ML IJ SOLN
INTRAMUSCULAR | Status: AC
  Filled 2019-05-28: qty 2

## 2019-05-28 MED ORDER — CHLORHEXIDINE GLUCONATE 4 % EX LIQD: 60.0000 mL | Freq: Once | CUTANEOUS | Status: DC

## 2019-05-28 MED ORDER — DOCUSATE SODIUM 100 MG PO CAPS
100.0000 mg | ORAL_CAPSULE | Freq: Two times a day (BID) | ORAL | Status: DC
  Administered 2019-05-28 – 2019-05-29 (×2): 100 mg via ORAL
  Filled 2019-05-28 (×2): qty 1

## 2019-05-28 MED ORDER — POLYETHYLENE GLYCOL 3350 17 G PO PACK
17.0000 g | PACK | Freq: Every day | ORAL | Status: DC | PRN
  Administered 2019-05-29: 17 g via ORAL
  Filled 2019-05-28: qty 1

## 2019-05-28 MED ORDER — EZETIMIBE 10 MG PO TABS
10.0000 mg | ORAL_TABLET | Freq: Every day | ORAL | Status: DC
  Administered 2019-05-29: 10 mg via ORAL
  Filled 2019-05-28: qty 1

## 2019-05-28 MED ORDER — DIPHENHYDRAMINE HCL 12.5 MG/5ML PO ELIX: 12.5000 mg | ORAL_SOLUTION | ORAL | Status: DC | PRN

## 2019-05-28 MED ORDER — EPHEDRINE 5 MG/ML INJ
INTRAVENOUS | Status: AC
  Filled 2019-05-28: qty 10

## 2019-05-28 MED ORDER — INSULIN ASPART 100 UNIT/ML ~~LOC~~ SOLN
0.0000 [IU] | Freq: Three times a day (TID) | SUBCUTANEOUS | Status: DC
  Administered 2019-05-29: 3 [IU] via SUBCUTANEOUS
  Administered 2019-05-29: 5 [IU] via SUBCUTANEOUS

## 2019-05-28 MED ORDER — ONDANSETRON HCL 4 MG/2ML IJ SOLN
INTRAMUSCULAR | Status: DC | PRN
  Administered 2019-05-28: 4 mg via INTRAVENOUS

## 2019-05-28 MED ORDER — DEXAMETHASONE SODIUM PHOSPHATE 10 MG/ML IJ SOLN
10.0000 mg | Freq: Once | INTRAMUSCULAR | Status: AC
  Administered 2019-05-29: 10 mg via INTRAVENOUS
  Filled 2019-05-28: qty 1

## 2019-05-28 MED ORDER — ONDANSETRON HCL 4 MG/2ML IJ SOLN
INTRAMUSCULAR | Status: AC
  Filled 2019-05-28: qty 2

## 2019-05-28 MED ORDER — SODIUM CHLORIDE (PF) 0.9 % IJ SOLN
INTRAMUSCULAR | Status: AC
  Filled 2019-05-28: qty 50

## 2019-05-28 MED ORDER — DEXAMETHASONE SODIUM PHOSPHATE 10 MG/ML IJ SOLN
8.0000 mg | Freq: Once | INTRAMUSCULAR | Status: AC
  Administered 2019-05-28: 4 mg via INTRAVENOUS

## 2019-05-28 MED ORDER — POVIDONE-IODINE 10 % EX SWAB
2.0000 "application " | Freq: Once | CUTANEOUS | Status: AC
  Administered 2019-05-28: 2 via TOPICAL

## 2019-05-28 MED ORDER — EPHEDRINE SULFATE 50 MG/ML IJ SOLN
INTRAMUSCULAR | Status: DC | PRN
  Administered 2019-05-28 (×2): 5 mg via INTRAVENOUS

## 2019-05-28 MED ORDER — FENTANYL CITRATE (PF) 100 MCG/2ML IJ SOLN: 25.0000 ug | INTRAMUSCULAR | Status: DC | PRN

## 2019-05-28 MED ORDER — PROPOFOL 500 MG/50ML IV EMUL
INTRAVENOUS | Status: DC | PRN
  Administered 2019-05-28: 25 ug/kg/min via INTRAVENOUS

## 2019-05-28 MED ORDER — METHOCARBAMOL 500 MG PO TABS
500.0000 mg | ORAL_TABLET | Freq: Four times a day (QID) | ORAL | Status: DC | PRN
  Administered 2019-05-28 – 2019-05-29 (×2): 500 mg via ORAL
  Filled 2019-05-28 (×2): qty 1

## 2019-05-28 MED ORDER — RIVAROXABAN 10 MG PO TABS
10.0000 mg | ORAL_TABLET | Freq: Every day | ORAL | Status: DC
  Administered 2019-05-29: 10 mg via ORAL
  Filled 2019-05-28: qty 1

## 2019-05-28 MED ORDER — FENTANYL CITRATE (PF) 100 MCG/2ML IJ SOLN
INTRAMUSCULAR | Status: AC
  Filled 2019-05-28: qty 2

## 2019-05-28 MED ORDER — METOCLOPRAMIDE HCL 5 MG/ML IJ SOLN: 5.0000 mg | Freq: Three times a day (TID) | INTRAMUSCULAR | Status: DC | PRN

## 2019-05-28 MED ORDER — SODIUM CHLORIDE (PF) 0.9 % IJ SOLN
INTRAMUSCULAR | Status: DC | PRN
  Administered 2019-05-28: 60 mL

## 2019-05-28 MED ORDER — PHENOL 1.4 % MT LIQD: 1.0000 | OROMUCOSAL | Status: DC | PRN

## 2019-05-28 MED ORDER — PROMETHAZINE HCL 25 MG/ML IJ SOLN: 6.2500 mg | INTRAMUSCULAR | Status: DC | PRN

## 2019-05-28 MED ORDER — LACTATED RINGERS IV SOLN
INTRAVENOUS | Status: DC
  Administered 2019-05-28: 09:00:00 via INTRAVENOUS

## 2019-05-28 MED ORDER — SODIUM CHLORIDE (PF) 0.9 % IJ SOLN
INTRAMUSCULAR | Status: AC
  Filled 2019-05-28: qty 10

## 2019-05-28 MED ORDER — DEXAMETHASONE SODIUM PHOSPHATE 10 MG/ML IJ SOLN
INTRAMUSCULAR | Status: AC
  Filled 2019-05-28: qty 1

## 2019-05-28 MED ORDER — OXYCODONE HCL 5 MG PO TABS
5.0000 mg | ORAL_TABLET | ORAL | Status: DC | PRN
  Administered 2019-05-28 (×2): 5 mg via ORAL
  Filled 2019-05-28 (×2): qty 1

## 2019-05-28 MED ORDER — MORPHINE SULFATE (PF) 2 MG/ML IV SOLN: 1.0000 mg | INTRAVENOUS | Status: DC | PRN

## 2019-05-28 MED ORDER — LIDOCAINE 2% (20 MG/ML) 5 ML SYRINGE
INTRAMUSCULAR | Status: AC
  Filled 2019-05-28: qty 5

## 2019-05-28 MED ORDER — BUPIVACAINE LIPOSOME 1.3 % IJ SUSP
INTRAMUSCULAR | Status: DC | PRN
  Administered 2019-05-28: 80 mL

## 2019-05-28 MED ORDER — GABAPENTIN 100 MG PO CAPS
100.0000 mg | ORAL_CAPSULE | Freq: Once | ORAL | Status: AC
  Administered 2019-05-28: 100 mg via ORAL
  Filled 2019-05-28: qty 1

## 2019-05-28 MED ORDER — ONDANSETRON HCL 4 MG PO TABS: 4.0000 mg | ORAL_TABLET | Freq: Four times a day (QID) | ORAL | Status: DC | PRN

## 2019-05-28 MED ORDER — MIDAZOLAM HCL 2 MG/2ML IJ SOLN
INTRAMUSCULAR | Status: AC
  Administered 2019-05-28: 2 mg via INTRAVENOUS
  Filled 2019-05-28: qty 2

## 2019-05-28 MED ORDER — TRAMADOL HCL 50 MG PO TABS
50.0000 mg | ORAL_TABLET | Freq: Four times a day (QID) | ORAL | Status: DC | PRN
  Administered 2019-05-29: 50 mg via ORAL
  Filled 2019-05-28: qty 1

## 2019-05-28 MED ORDER — ACETAMINOPHEN 500 MG PO TABS
1000.0000 mg | ORAL_TABLET | Freq: Four times a day (QID) | ORAL | Status: AC
  Administered 2019-05-28 – 2019-05-29 (×4): 1000 mg via ORAL
  Filled 2019-05-28 (×4): qty 2

## 2019-05-28 MED ORDER — BISACODYL 10 MG RE SUPP: 10.0000 mg | Freq: Every day | RECTAL | Status: DC | PRN

## 2019-05-28 MED ORDER — MIDAZOLAM HCL 5 MG/5ML IJ SOLN
INTRAMUSCULAR | Status: DC | PRN
  Administered 2019-05-28 (×2): 0.5 mg via INTRAVENOUS

## 2019-05-28 MED ORDER — CEFAZOLIN SODIUM-DEXTROSE 1-4 GM/50ML-% IV SOLN
1.0000 g | Freq: Four times a day (QID) | INTRAVENOUS | Status: AC
  Administered 2019-05-28 – 2019-05-29 (×2): 1 g via INTRAVENOUS
  Filled 2019-05-28 (×2): qty 50

## 2019-05-28 MED ORDER — MENTHOL 3 MG MT LOZG: 1.0000 | LOZENGE | OROMUCOSAL | Status: DC | PRN

## 2019-05-28 MED ORDER — SODIUM CHLORIDE 0.9 % IV SOLN
INTRAVENOUS | Status: DC
  Administered 2019-05-28: 16:00:00 via INTRAVENOUS

## 2019-05-28 MED ORDER — ONDANSETRON HCL 4 MG/2ML IJ SOLN: 4.0000 mg | Freq: Four times a day (QID) | INTRAMUSCULAR | Status: DC | PRN

## 2019-05-28 MED ORDER — INSULIN GLARGINE 100 UNIT/ML ~~LOC~~ SOLN
15.0000 [IU] | Freq: Every day | SUBCUTANEOUS | Status: DC
  Administered 2019-05-29: 15 [IU] via SUBCUTANEOUS
  Filled 2019-05-28: qty 0.15

## 2019-05-28 MED ORDER — TRANEXAMIC ACID-NACL 1000-0.7 MG/100ML-% IV SOLN
1000.0000 mg | INTRAVENOUS | Status: AC
  Administered 2019-05-28: 1000 mg via INTRAVENOUS
  Filled 2019-05-28: qty 100

## 2019-05-28 SURGICAL SUPPLY — 54 items
BAG DECANTER FOR FLEXI CONT (MISCELLANEOUS) ×1 IMPLANT
BAG SPEC THK2 15X12 ZIP CLS (MISCELLANEOUS)
BAG ZIPLOCK 12X15 (MISCELLANEOUS) IMPLANT
BLADE SAG 18X100X1.27 (BLADE) ×2 IMPLANT
BLADE SAW SGTL 11.0X1.19X90.0M (BLADE) ×2 IMPLANT
BLADE SURG SZ10 CARB STEEL (BLADE) ×4 IMPLANT
BNDG ELASTIC 6X5.8 VLCR STR LF (GAUZE/BANDAGES/DRESSINGS) ×2 IMPLANT
CLOTH BEACON ORANGE TIMEOUT ST (SAFETY) ×2 IMPLANT
COVER SURGICAL LIGHT HANDLE (MISCELLANEOUS) ×2 IMPLANT
COVER WAND RF STERILE (DRAPES) IMPLANT
CUFF TOURN SGL QUICK 34 (TOURNIQUET CUFF) ×2
CUFF TRNQT CYL 34X4.125X (TOURNIQUET CUFF) ×1 IMPLANT
DECANTER SPIKE VIAL GLASS SM (MISCELLANEOUS) IMPLANT
DRAPE U-SHAPE 47X51 STRL (DRAPES) ×2 IMPLANT
DRSG ADAPTIC 3X8 NADH LF (GAUZE/BANDAGES/DRESSINGS) ×2 IMPLANT
DRSG PAD ABDOMINAL 8X10 ST (GAUZE/BANDAGES/DRESSINGS) ×2 IMPLANT
DURAPREP 26ML APPLICATOR (WOUND CARE) ×2 IMPLANT
ELECT REM PT RETURN 15FT ADLT (MISCELLANEOUS) ×2 IMPLANT
EVACUATOR 1/8 PVC DRAIN (DRAIN) ×2 IMPLANT
GAUZE SPONGE 4X4 12PLY STRL (GAUZE/BANDAGES/DRESSINGS) ×2 IMPLANT
GLOVE BIO SURGEON STRL SZ7 (GLOVE) ×2 IMPLANT
GLOVE BIO SURGEON STRL SZ8 (GLOVE) ×2 IMPLANT
GLOVE BIOGEL PI IND STRL 7.0 (GLOVE) ×1 IMPLANT
GLOVE BIOGEL PI IND STRL 8 (GLOVE) ×1 IMPLANT
GLOVE BIOGEL PI INDICATOR 7.0 (GLOVE) ×1
GLOVE BIOGEL PI INDICATOR 8 (GLOVE) ×1
GOWN STRL REUS W/TWL LRG LVL3 (GOWN DISPOSABLE) ×5 IMPLANT
HANDPIECE INTERPULSE COAX TIP (DISPOSABLE) ×2
HOLDER FOLEY CATH W/STRAP (MISCELLANEOUS) ×1 IMPLANT
IMMOBILIZER KNEE 20 (SOFTGOODS)
IMMOBILIZER KNEE 20 THIGH 36 (SOFTGOODS) ×1 IMPLANT
INSERT TIBIAL PFC 3 17.5 (Knees) ×1 IMPLANT
KIT TURNOVER KIT A (KITS) ×1 IMPLANT
MANIFOLD NEPTUNE II (INSTRUMENTS) ×2 IMPLANT
NS IRRIG 1000ML POUR BTL (IV SOLUTION) ×2 IMPLANT
PACK TOTAL KNEE CUSTOM (KITS) ×2 IMPLANT
PADDING CAST COTTON 6X4 STRL (CAST SUPPLIES) ×4 IMPLANT
PENCIL SMOKE EVACUATOR (MISCELLANEOUS) IMPLANT
PROTECTOR NERVE ULNAR (MISCELLANEOUS) ×2 IMPLANT
SET HNDPC FAN SPRY TIP SCT (DISPOSABLE) ×1 IMPLANT
STRIP CLOSURE SKIN 1/2X4 (GAUZE/BANDAGES/DRESSINGS) ×2 IMPLANT
SUT MNCRL AB 4-0 PS2 18 (SUTURE) ×2 IMPLANT
SUT STRATAFIX 0 PDS 27 VIOLET (SUTURE) ×2
SUT VIC AB 2-0 CT1 27 (SUTURE) ×6
SUT VIC AB 2-0 CT1 TAPERPNT 27 (SUTURE) ×3 IMPLANT
SUTURE STRATFX 0 PDS 27 VIOLET (SUTURE) ×1 IMPLANT
SWAB COLLECTION DEVICE MRSA (MISCELLANEOUS) IMPLANT
SWAB CULTURE ESWAB REG 1ML (MISCELLANEOUS) IMPLANT
SYR 50ML LL SCALE MARK (SYRINGE) ×4 IMPLANT
TOWER CARTRIDGE SMART MIX (DISPOSABLE) ×1 IMPLANT
TRAY FOLEY MTR SLVR 16FR STAT (SET/KITS/TRAYS/PACK) ×2 IMPLANT
TUBE KAMVAC SUCTION (TUBING) IMPLANT
WATER STERILE IRR 1000ML POUR (IV SOLUTION) ×2 IMPLANT
WRAP KNEE MAXI GEL POST OP (GAUZE/BANDAGES/DRESSINGS) ×1 IMPLANT

## 2019-05-28 NOTE — Transfer of Care (Signed)
Immediate Anesthesia Transfer of Care Note  Patient: Caroline Pratt  Procedure(s) Performed: Right knee polyethylene exchange  (Right Knee)  Patient Location: PACU  Anesthesia Type:Spinal  Level of Consciousness: awake, alert  and oriented  Airway & Oxygen Therapy: Patient Spontanous Breathing and Patient connected to face mask oxygen  Post-op Assessment: Report given to RN and Post -op Vital signs reviewed and stable  Post vital signs: Reviewed and stable  Last Vitals:  Vitals Value Taken Time  BP 123/59 05/28/19 1435  Temp    Pulse 64 05/28/19 1437  Resp 19 05/28/19 1437  SpO2 100 % 05/28/19 1437  Vitals shown include unvalidated device data.  Last Pain:  Vitals:   05/28/19 0923  TempSrc:   PainSc: 4       Patients Stated Pain Goal: 4 ([id number])  Complications: No apparent anesthesia complications

## 2019-05-28 NOTE — Op Note (Signed)
NAME: Caroline Pratt, PIQUE MEDICAL RECORD [id number] ACCOUNT [id number] DATE OF BIRTH:[birth year] FACILITY: [facility] LOCATION: [facility] PHYSICIAN:Aum Caggiano Zella Ball, MD  OPERATIVE REPORT  DATE OF PROCEDURE:  05/28/2019  PREOPERATIVE DIAGNOSIS:  Unstable right total knee arthroplasty.  POSTOPERATIVE DIAGNOSIS:  Unstable right total knee arthroplasty.  PROCEDURE:  Right knee polyethylene revision.  SURGEON:  Gaynelle Arabian, MD  ASSISTANT:  Griffith Citron, PA-C  ANESTHESIA:  Adductor canal block and spinal.  ESTIMATED BLOOD LOSS:  Minimal.  DRAIN:  Hemovac x1.  TOURNIQUET TIME:  18 minutes at 300 mmHg.  COMPLICATIONS:  None.  CONDITION:  Stable to recovery.  BRIEF CLINICAL NOTE:  The patient is a 78 year old female who had a right total knee arthroplasty done approximately 10 years ago.  I saw her in second opinion last year.  She was noted to have an unstable right total knee.  She was having pain secondary  to the instability.  She had a negative bone scan showing no signs of loosening.  She presents now for polyethylene versus total knee revision.  PROCEDURE IN DETAIL:  After successful administration of adductor canal block and spinal anesthetic exam of the right knee under anesthesia was performed.  She has gross varus valgus laxity in extension and extremely significant AP laxity in 90 degrees  of flexion.  The tourniquet was placed high on the right thigh and the right lower extremity was prepped and draped in the usual sterile fashion.  Extremity was wrapped in Esmarch, knee flexed and tourniquet inflated to 300 mmHg.  Midline incision was  made with a 10 blade through subcutaneous tissue to the level of the extensor mechanism.  There was no evidence of any significant effusion in the joint.  Synovectomy was first performed.  The soft tissue over the proximal medial tibia was then  subperiosteally elevated to the joint line with a knife and to the semimembranosus bursa with a Cobb  elevator.  Soft tissue laterally was elevated with attention being paid to avoid the patellar tendon on the tibial tubercle.  Patella was everted, knee  flexed 90 degrees.  I was able to dislocate the tibia from the femur and remove the tibial component.  It was 10 mm thick for a size 3 posterior stabilized rotating platform prosthesis.  I went to a 15 mm thickness first.  I placed a trial posterior  stabilized rotating platform insert, 15 mm thickness into the tibial tray and then reduced it.  She still had a small amount of varus valgus laxity to 15, so I went up to 17.5, which had outstanding stability throughout full range of motion from 0-125  degrees.  There was excellent varus/valgus and anterior posterior stability.  I felt that this would be the most appropriate insert to use.  Thus, the permanent size 3 posterior stabilized rotating platform insert 17.5 mm thick was utilized and was  placed into the tibial tray.  The knee was reduced with outstanding stability throughout full range of motion.  Wound was copiously irrigated with saline solution.  Exparel 20 mL mixed with 60 mL of saline was injected into the posterior capsule,  extensor mechanism, periosteum of the femur and subcutaneous tissues.  Arthrotomy was closed over a Hemovac drain with running 0 Stratafix suture.  Flexion against gravity was 130 degrees.  Patella tracks normally.  Tourniquet was released.  Total time  18 minutes.  Subcutaneous was closed with interrupted 2-0 Vicryl and subcuticular running 4-0 Monocryl.  Incision was cleaned and dried and Steri-Strips and  a bulky sterile dressing applied.  She was then awakened and transported to recovery in stable  condition.  Note that a surgical assistant was a medical necessity for this procedure to do it in a safe and expeditious manner.  Surgical assistance was necessary for proper positioning of the limb and retraction of the neurovascular structures.  Limb positioning  was  essential for safe removal of the old implant and safe and accurate placement of the new implant.  VN/NUANCE  D:05/28/2019 T:05/28/2019 JOB:[id number]

## 2019-05-28 NOTE — Brief Op Note (Signed)
05/28/2019  2:14 PM  PATIENT:  Caroline Pratt  78 y.o. female  PRE-OPERATIVE DIAGNOSIS:  unstable right total knee arthroplasty  POST-OPERATIVE DIAGNOSIS:  failed right total knee  PROCEDURE:  Procedure(s) with comments: Right knee polyethylene exchange  (Right) - 153min  SURGEON:  Surgeon(s) and Role:    Gaynelle Arabian, MD - Primary  PHYSICIAN ASSISTANT:   ASSISTANTS: Griffith Citron, PA-C   ANESTHESIA:   Adductor canal block and spinal  EBL:  20 mL   BLOOD ADMINISTERED:none  DRAINS: (medium) Hemovact drain(s) in the right knee with  Suction Open   LOCAL MEDICATIONS USED:  OTHER Exparel  COUNTS:  YES  TOURNIQUET:  18 minutews @ 300 mmHg  DICTATION: .Other Dictation: Dictation Number [id number]  PLAN OF CARE: Admit to inpatient   PATIENT DISPOSITION:  PACU - hemodynamically stable.

## 2019-05-28 NOTE — Discharge Instructions (Addendum)
Dr. Gaynelle Arabian Total Joint Specialist [facility] [address] [place], [place] [address] [phone]  TOTAL KNEE REVISION POSTOPERATIVE DIRECTIONS  Knee Rehabilitation, Guidelines Following Surgery  Results after knee surgery are often greatly improved when you follow the exercise, range of motion and muscle strengthening exercises prescribed by your doctor. Safety measures are also important to protect the knee from further injury. Any time any of these exercises cause you to have increased pain or swelling in your knee joint, decrease the amount until you are comfortable again and slowly increase them. If you have problems or questions, call your caregiver or physical therapist for advice.   HOME CARE INSTRUCTIONS   Remove items at home which could result in a fall. This includes throw rugs or furniture in walking pathways.   ICE to the affected knee every three hours for 30 minutes at a time and then as needed for pain and swelling.  Continue to use ice on the knee for pain and swelling from surgery. You may notice swelling that will progress down to the foot and ankle.  This is normal after surgery.  Elevate the leg when you are not up walking on it.    Continue to use the breathing machine which will help keep your temperature down.  It is common for your temperature to cycle up and down following surgery, especially at night when you are not up moving around and exerting yourself.  The breathing machine keeps your lungs expanded and your temperature down.  Do not place pillow under knee, focus on keeping the knee straight while resting  DIET You may resume your previous home diet once your are discharged from the hospital.  DRESSING / WOUND CARE / SHOWERING You may change your dressing 3-5 days after surgery.  Then change the dressing every day with sterile gauze.  Please use good hand washing techniques before changing the dressing.  Do not use any lotions or  creams on the incision until instructed by your surgeon. You may start showering once you are discharged home but do not submerge the incision under water. Just pat the incision dry and apply a dry gauze dressing on daily. Change the surgical dressing daily and reapply a dry dressing each time.  ACTIVITY Walk with your walker as instructed. Use walker as long as suggested by your caregivers. Avoid periods of inactivity such as sitting longer than an hour when not asleep. This helps prevent blood clots.  You may resume a sexual relationship in one month or when given the OK by your doctor.  You may return to work once you are cleared by your doctor.  Do not drive a car for 6 weeks or until released by you surgeon.  Do not drive while taking narcotics.  WEIGHT BEARING Weight bearing as tolerated with assist device (walker, cane, etc) as directed, use it as long as suggested by your surgeon or therapist, typically at least 4-6 weeks.  POSTOPERATIVE CONSTIPATION PROTOCOL Constipation - defined medically as fewer than three stools per week and severe constipation as less than one stool per week.  One of the most common issues patients have following surgery is constipation.  Even if you have a regular bowel pattern at home, your normal regimen is likely to be disrupted due to multiple reasons following surgery.  Combination of anesthesia, postoperative narcotics, change in appetite and fluid intake all can affect your bowels.  In order to avoid complications following surgery, here are some  recommendations in order to help you during your recovery period. ° °Colace (docusate) - Pick up an over-the-counter form of Colace or another stool softener and take twice a day as long as you are requiring postoperative pain medications.  Take with a full glass of water daily.  If you experience loose stools or diarrhea, hold the colace until you stool forms back up.  If your symptoms do not get better within 1  week or if they get worse, check with your doctor. ° °Dulcolax (bisacodyl) - Pick up over-the-counter and take as directed by the product packaging as needed to assist with the movement of your bowels.  Take with a full glass of water.  Use this product as needed if not relieved by Colace only.  ° °MiraLax (polyethylene glycol) - Pick up over-the-counter to have on hand.  MiraLax is a solution that will increase the amount of water in your bowels to assist with bowel movements.  Take as directed and can mix with a glass of water, juice, soda, coffee, or tea.  Take if you go more than two days without a movement. °Do not use MiraLax more than once per day. Call your doctor if you are still constipated or irregular after using this medication for 7 days in a row. ° °If you continue to have problems with postoperative constipation, please contact the office for further assistance and recommendations.  If you experience "the worst abdominal pain ever" or develop nausea or vomiting, please contact the office immediatly for further recommendations for treatment. ° °ITCHING °If you experience itching with your medications, try taking only a single pain pill, or even half a pain pill at a time.  You can also use Benadryl over the counter for itching or also to help with sleep.  ° °TED HOSE STOCKINGS °Wear the elastic stockings on both legs for three weeks following surgery during the day but you may remove then at night for sleeping. ° °MEDICATIONS °See your medication summary on the “After Visit Summary” that the nursing staff will review with you prior to discharge.  You may have some home medications which will be placed on hold until you complete the course of blood thinner medication.  It is important for you to complete the blood thinner medication as prescribed by your surgeon.  Continue your approved medications as instructed at time of discharge. ° °PRECAUTIONS °If you experience chest pain or shortness of breath -  call 911 immediately for transfer to the hospital emergency department.  °If you develop a fever greater that 101 F, purulent drainage from wound, increased redness or drainage from wound, foul odor from the wound/dressing, or calf pain - CONTACT YOUR SURGEON.   °                                                °FOLLOW-UP APPOINTMENTS °Make sure you keep all of your appointments after your operation with your surgeon and caregivers. You should call the office at the above phone number and make an appointment for approximately two weeks after the date of your surgery or on the date instructed by your surgeon outlined in the "After Visit Summary". ° °RANGE OF MOTION AND STRENGTHENING EXERCISES  °Rehabilitation of the knee is important following a knee injury or an operation. After just a few days of immobilization, the muscles of   the thigh which control the knee become weakened and shrink (atrophy). Knee exercises are designed to build up the tone and strength of the thigh muscles and to improve knee motion. Often times heat used for twenty to thirty minutes before working out will loosen up your tissues and help with improving the range of motion but do not use heat for the first two weeks following surgery. These exercises can be done on a training (exercise) mat, on the floor, on a table or on a bed. Use what ever works the best and is most comfortable for you Knee exercises include:   Leg Lifts - While your knee is still immobilized in a splint or cast, you can do straight leg raises. Lift the leg to 60 degrees, hold for 3 sec, and slowly lower the leg. Repeat 10-20 times 2-3 times daily. Perform this exercise against resistance later as your knee gets better.   Quad and Hamstring Sets - Tighten up the muscle on the front of the thigh (Quad) and hold for 5-10 sec. Repeat this 10-20 times hourly. Hamstring sets are done by pushing the foot backward against an object and holding for 5-10 sec. Repeat as with quad  sets.   Leg Slides: Lying on your back, slowly slide your foot toward your buttocks, bending your knee up off the floor (only go as far as is comfortable). Then slowly slide your foot back down until your leg is flat on the floor again.  Angel Wings: Lying on your back spread your legs to the side as far apart as you can without causing discomfort.  A rehabilitation program following serious knee injuries can speed recovery and prevent re-injury in the future due to weakened muscles. Contact your doctor or a physical therapist for more information on knee rehabilitation.   IF YOU ARE TRANSFERRED TO A SKILLED REHAB FACILITY If the patient is transferred to a skilled rehab facility following release from the hospital, a list of the current medications will be sent to the facility for the patient to continue.  When discharged from the skilled rehab facility, please have the facility set up the patient's [place] prior to being released. Also, the skilled facility will be responsible for providing the patient with their medications at time of release from the facility to include their pain medication, the muscle relaxants, and their blood thinner medication. If the patient is still at the rehab facility at time of the two week follow up appointment, the skilled rehab facility will also need to assist the patient in arranging follow up appointment in our office and any transportation needs.  MAKE SURE YOU:   Understand these instructions.   Get help right away if you are not doing well or get worse.    Pick up stool softner and laxative for home use following surgery while on pain medications. Do not submerge incision under water. Please use good hand washing techniques while changing dressing each day. May shower starting three days after surgery. Please use a clean towel to pat the incision dry following showers. Continue to use ice for pain and swelling after surgery. Do not  use any lotions or creams on the incision until instructed by your surgeon.   Information on my medicine - XARELTO (Rivaroxaban)  Why was Xarelto prescribed for you? Xarelto was prescribed for you to reduce the risk of blood clots forming after orthopedic surgery. The medical term for these abnormal blood clots is venous thromboembolism (VTE).  What do you need to know about xarelto ? Take your Xarelto ONCE DAILY at the same time every day. You may take it either with or without food.  If you have difficulty swallowing the tablet whole, you may crush it and mix in applesauce just prior to taking your dose.  Take Xarelto exactly as prescribed by your doctor and DO NOT stop taking Xarelto without talking to the doctor who prescribed the medication.  Stopping without other VTE prevention medication to take the place of Xarelto may increase your risk of developing a clot.  After discharge, you should have regular check-up appointments with your healthcare provider that is prescribing your Xarelto.    What do you do if you miss a dose? If you miss a dose, take it as soon as you remember on the same day then continue your regularly scheduled once daily regimen the next day. Do not take two doses of Xarelto on the same day.   Important Safety Information A possible side effect of Xarelto is bleeding. You should call your healthcare provider right away if you experience any of the following: ? Bleeding from an injury or your nose that does not stop. ? Unusual colored urine (red or dark brown) or unusual colored stools (red or black). ? Unusual bruising for unknown reasons. ? A serious fall or if you hit your head (even if there is no bleeding).  Some medicines may interact with Xarelto and might increase your risk of bleeding while on Xarelto. To help avoid this, consult your healthcare provider or pharmacist prior to using any new prescription or non-prescription medications,  including herbals, vitamins, non-steroidal anti-inflammatory drugs (NSAIDs) and supplements.  This website has more information on Xarelto: [email or web address]

## 2019-05-28 NOTE — Anesthesia Postprocedure Evaluation (Signed)
Anesthesia Post Note  Patient: Caroline Pratt  Procedure(s) Performed: Right knee polyethylene exchange  (Right Knee)     Patient location during evaluation: PACU Anesthesia Type: Spinal Level of consciousness: awake and alert Pain management: pain level controlled Vital Signs Assessment: post-procedure vital signs reviewed and stable Respiratory status: spontaneous breathing and respiratory function stable Cardiovascular status: blood pressure returned to baseline and stable Postop Assessment: spinal receding Anesthetic complications: no    Last Vitals:  Vitals:   05/28/19 1515 05/28/19 1530  BP: 135/69 (!) 134/58  Pulse: 60 65  Resp: 14 15  Temp:  (!) 36.4 C  SpO2: 100% 100%    Last Pain:  Vitals:   05/28/19 1530  TempSrc:   PainSc: 0-No pain                 Taegan Haider DANIEL

## 2019-05-28 NOTE — Anesthesia Preprocedure Evaluation (Addendum)
Anesthesia Evaluation  Patient identified by MRN, date of birth, ID band Patient awake    Reviewed: Allergy & Precautions, NPO status , Patient's Chart, lab work & pertinent test results  History of Anesthesia Complications Negative for: history of anesthetic complications  Airway Mallampati: II  TM Distance: >3 FB Neck ROM: Full    Dental no notable dental hx. (+) Dental Advisory Given   Pulmonary neg pulmonary ROS,    Pulmonary exam normal        Cardiovascular negative cardio ROS Normal cardiovascular exam     Neuro/Psych negative neurological ROS     GI/Hepatic negative GI ROS, Neg liver ROS,   Endo/Other  diabetes  Renal/GU negative Renal ROS     Musculoskeletal negative musculoskeletal ROS (+)   Abdominal   Peds  Hematology negative hematology ROS (+)   Anesthesia Other Findings Day of surgery medications reviewed with the patient.  Reproductive/Obstetrics                            Anesthesia Physical Anesthesia Plan  ASA: III  Anesthesia Plan: Spinal   Post-op Pain Management:  Regional for Post-op pain   Induction:   PONV Risk Score and Plan: 2 and Ondansetron, Dexamethasone and Propofol infusion  Airway Management Planned: Natural Airway and Simple Face Mask  Additional Equipment:   Intra-op Plan:   Post-operative Plan:   Informed Consent: I have reviewed the patients History and Physical, chart, labs and discussed the procedure including the risks, benefits and alternatives for the proposed anesthesia with the patient or authorized representative who has indicated his/her understanding and acceptance.     Dental advisory given  Plan Discussed with: Anesthesiologist  Anesthesia Plan Comments:        Anesthesia Quick Evaluation

## 2019-05-28 NOTE — Anesthesia Procedure Notes (Signed)
Spinal  Patient location during procedure: OR Start time: 05/28/2019 1:09 PM End time: 05/28/2019 1:14 PM Staffing Resident/CRNA: Garrel Ridgel, CRNA Performed: resident/CRNA  Preanesthetic Checklist Completed: patient identified, site marked, surgical consent, pre-op evaluation, timeout performed, IV checked, risks and benefits discussed and monitors and equipment checked Spinal Block Patient position: sitting Prep: Betadine Patient monitoring: heart rate, continuous pulse ox and blood pressure Location: L4-5 Injection technique: single-shot Needle Needle type: Sprotte and Pencan  Needle gauge: 24 G Needle length: 9 cm Needle insertion depth: 4 cm Assessment Sensory level: T10 Additional Notes Expiration date of kit checked and confirmed. Patient tolerated procedure well, without complications.

## 2019-05-28 NOTE — Interval H&P Note (Signed)
History and Physical Interval Note:  05/28/2019 9:36 AM  Caroline Pratt  has presented today for surgery, with the diagnosis of unstable right total knee arthroplasty.  The various methods of treatment have been discussed with the patient and family. After consideration of risks, benefits and other options for treatment, the patient has consented to  Procedure(s) with comments: Right knee polyethylene vs total knee arthroplasty revision (Right) - 153min as a surgical intervention.  The patient's history has been reviewed, patient examined, no change in status, stable for surgery.  I have reviewed the patient's chart and labs.  Questions were answered to the patient's satisfaction.     Pilar Plate Milo Solana

## 2019-05-28 NOTE — Plan of Care (Signed)
Plan of care 

## 2019-05-28 NOTE — Evaluation (Signed)
Physical Therapy Evaluation Patient Details Name: Caroline Pratt MRN: [id number] DOB: 11/08/1940 Today's Date: 05/28/2019   History of Present Illness  Patient is 78 y.o. female s/p Rt TKR with polyexchange on 05/28/19 with PMH significant for osteoporosis, OA, DM, and breast cancer.    Clinical Impression  Caroline Pratt is a 78 y.o. female POD 0 s/p Rt TKR. Patient reports independence with mobility at baseline. Patient is now limited by functional impairments (see PT problem list below) and requires min assist for transfers and gait with RW. Patient was able to ambulate ~12 feet with RW and min assist and was limited by nausea and lightheadedness. Patient instructed in exercise to facilitate circulation. Patient will benefit from continued skilled PT interventions to address impairments and progress towards PLOF. Acute PT will follow to progress mobility and stair training in preparation for safe discharge home.    Follow Up Recommendations Follow surgeon's recommendation for DC plan and follow-up therapies    Equipment Recommendations  Rolling walker with 5" wheels;3in1 (PT)    Recommendations for Other Services       Precautions / Restrictions Precautions Precautions: Fall Restrictions Weight Bearing Restrictions: No      Mobility  Bed Mobility Overal bed mobility: Needs Assistance Bed Mobility: Supine to Sit     Supine to sit: Min assist;HOB elevated     General bed mobility comments: no assist required, cues for sequencing  Transfers Overall transfer level: Needs assistance Equipment used: Rolling walker (2 wheeled) Transfers: Sit to/from Stand Sit to Stand: Min assist         General transfer comment: cues for safe hand placement and technique with RW, assist to initiate power up and complete rise  Ambulation/Gait Ambulation/Gait assistance: Min assist Gait Distance (Feet): 12 Feet Assistive device: Rolling walker (2 wheeled) Gait Pattern/deviations:  Step-through pattern;Decreased stride length;Decreased stance time - right Gait velocity: decreased   General Gait Details: pt with good step sequencing in RW and maintained good proximity, min assist to steady and cues to deter Rt knee buckling iwth UE support on RW; pt limited by nausea and lightheadedness which resolved with seated rest break  Stairs            Wheelchair Mobility    Modified Rankin (Stroke Patients Only)       Balance Overall balance assessment: Needs assistance Sitting-balance support: No upper extremity supported;Feet supported Sitting balance-Leahy Scale: Good     Standing balance support: During functional activity;Bilateral upper extremity supported Standing balance-Leahy Scale: Poor            Pertinent Vitals/Pain Pain Assessment: Faces Faces Pain Scale: Hurts little more Pain Location: Rt knee Pain Descriptors / Indicators: Aching Pain Intervention(s): Limited activity within patient's tolerance;Monitored during session;Repositioned    Home Living Family/patient expects to be discharged to:: Private residence Living Arrangements: Spouse/significant other Available Help at Discharge: Available 24 hours/day;Family Type of Home: House Home Access: Stairs to enter Entrance Stairs-Rails: Right Entrance Stairs-Number of Steps: 3 at side Home Layout: One level Home Equipment: Grab bars - tub/shower Additional Comments: needs a walker and BSC    Prior Function Level of Independence: Independent               Hand Dominance   Dominant Hand: Left    Extremity/Trunk Assessment   Upper Extremity Assessment Upper Extremity Assessment: Overall WFL for tasks assessed    Lower Extremity Assessment Lower Extremity Assessment: Generalized weakness;RLE deficits/detail RLE Deficits / Details: good quad activation in  supine, knee flexed during SLR, 4-/5 for quad strength with MMT RLE Sensation: WNL    Cervical / Trunk  Assessment Cervical / Trunk Assessment: Normal  Communication   Communication: No difficulties  Cognition Arousal/Alertness: Awake/alert Behavior During Therapy: WFL for tasks assessed/performed Overall Cognitive Status: Within Functional Limits for tasks assessed         General Comments      Exercises Total Joint Exercises Ankle Circles/Pumps: AROM;15 reps;Seated;Both   Assessment/Plan    PT Assessment Patient needs continued PT services  PT Problem List Decreased strength;Decreased balance;Decreased mobility;Decreased range of motion;Decreased activity tolerance;Decreased knowledge of use of DME       PT Treatment Interventions DME instruction;Functional mobility training;Gait training;Therapeutic activities;Stair training;Therapeutic exercise;Balance training;Modalities;Patient/family education    PT Goals (Current goals can be found in the Care Plan section)  Acute Rehab PT Goals Patient Stated Goal: to return home and get walking more PT Goal Formulation: With patient Time For Goal Achievement: 06/04/19 Potential to Achieve Goals: Good    Frequency 7X/week    AM-PAC PT "6 Clicks" Mobility  Outcome Measure Help needed turning from your back to your side while in a flat bed without using bedrails?: A Little Help needed moving from lying on your back to sitting on the side of a flat bed without using bedrails?: A Little Help needed moving to and from a bed to a chair (including a wheelchair)?: A Little Help needed standing up from a chair using your arms (e.g., wheelchair or bedside chair)?: A Little Help needed to walk in hospital room?: A Little Help needed climbing 3-5 steps with a railing? : A Little 6 Click Score: 18    End of Session Equipment Utilized During Treatment: Gait belt Activity Tolerance: Patient tolerated treatment well Patient left: in chair;with call bell/phone within reach;with family/visitor present;with chair alarm set Nurse Communication:  Mobility status PT Visit Diagnosis: Muscle weakness (generalized) (M62.81);Difficulty in walking, not elsewhere classified (R26.2)    Time: [id number] PT Time Calculation (min) (ACUTE ONLY): 26 min   Charges:   PT Evaluation $PT Eval Low Complexity: 1 Low         Kipp Brood, PT, DPT Physical Therapist with [facility]  05/28/2019 7:31 PM

## 2019-05-28 NOTE — Progress Notes (Signed)
Assisted Dr. Singer with right, ultrasound guided, adductor canal block. Side rails up, monitors on throughout procedure. See vital signs in flow sheet. Tolerated Procedure well.  

## 2019-05-29 ENCOUNTER — Encounter (HOSPITAL_COMMUNITY): Payer: Self-pay | Admitting: Orthopedic Surgery

## 2019-05-29 LAB — BASIC METABOLIC PANEL
Anion gap: 14 (ref 5–15)
BUN: 15 mg/dL (ref 8–23)
CO2: 15 mmol/L — ABNORMAL LOW (ref 22–32)
Calcium: 8.6 mg/dL — ABNORMAL LOW (ref 8.9–10.3)
Chloride: 108 mmol/L (ref 98–111)
Creatinine, Ser: 0.71 mg/dL (ref 0.44–1.00)
GFR calc Af Amer: >60 mL/min (ref >60)
GFR calc non Af Amer: >60 mL/min (ref >60)
Glucose, Bld: 210 mg/dL — ABNORMAL HIGH (ref 70–99)
Potassium: 4.6 mmol/L (ref 3.5–5.1)
Sodium: 137 mmol/L (ref 135–145)

## 2019-05-29 LAB — CBC
HCT: 40.5 % (ref 36.0–46.0)
Hemoglobin: 12.9 g/dL (ref 12.0–15.0)
MCH: 30.6 pg (ref 26.0–34.0)
MCHC: 31.9 g/dL (ref 30.0–36.0)
MCV: 96 fL (ref 80.0–100.0)
Platelets: 266 10*3/uL (ref 150–400)
RBC: 4.22 MIL/uL (ref 3.87–5.11)
RDW: 12.4 % (ref 11.5–15.5)
WBC: 11.6 10*3/uL — ABNORMAL HIGH (ref 4.0–10.5)
nRBC: 0 % (ref 0.0–0.2)

## 2019-05-29 LAB — GLUCOSE, CAPILLARY
Glucose-Capillary: 178 mg/dL — ABNORMAL HIGH (ref 70–99)
Glucose-Capillary: 210 mg/dL — ABNORMAL HIGH (ref 70–99)

## 2019-05-29 MED ORDER — OXYCODONE HCL 5 MG PO TABS: 5.0000 mg | ORAL_TABLET | Freq: Four times a day (QID) | ORAL | 0 refills | Status: DC | PRN

## 2019-05-29 MED ORDER — RIVAROXABAN 10 MG PO TABS: 10.0000 mg | ORAL_TABLET | Freq: Every day | ORAL | 0 refills | Status: DC

## 2019-05-29 MED ORDER — METHOCARBAMOL 500 MG PO TABS: 500.0000 mg | ORAL_TABLET | Freq: Four times a day (QID) | ORAL | 0 refills | Status: DC | PRN

## 2019-05-29 MED ORDER — TRAMADOL HCL 50 MG PO TABS: 50.0000 mg | ORAL_TABLET | Freq: Four times a day (QID) | ORAL | 0 refills | Status: DC | PRN

## 2019-05-29 NOTE — Progress Notes (Signed)
RN notified on call physician, that the pt pulled out her Hemovac while she was asleep.   Orders Received: Reinforce dressing as needed.

## 2019-05-29 NOTE — TOC Progression Note (Signed)
Transition of Care [facility]) - Progression Note    Patient Details  Name: Caroline Pratt MRN: [id number] Date of Birth: 1941-07-05  Transition of Care [facility]) CM/SW [place], LCSW Phone Number: 05/29/2019, 10:30 AM  Clinical Narrative:    Therapy Plan: OPPT DME delivered through [place]     Barriers to Discharge: Barriers Resolved  Expected Discharge Plan and Services           Expected Discharge Date: 05/29/19               DME Arranged: 3-N-1, Walker rolling DME Agency: [employer] Date DME Agency Contacted: 05/29/19 Time DME Agency Contacted: 0900 Representative spoke with at DME Agency: [place] ([place]) Interventions    Readmission Risk Interventions No flowsheet data found.

## 2019-05-29 NOTE — Progress Notes (Signed)
   Subjective: 1 Day Post-Op Procedure(s) (LRB): Right knee polyethylene exchange  (Right) Patient reports pain as mild.   Patient seen in rounds by Dr. Wynelle Link. Patient was having issues with nausea and lightheadedness yesterday, states she is feeling better this AM. No issues overnight. Denies chest pain, SOB, or calf pain. Foley catheter to be removed. States she is ready to go home.  We will continue therapy today.   Objective: Vital signs in last 24 hours: Temp:  [97.5 F (36.4 C)-98.3 F (36.8 C)] 97.8 F (36.6 C) (11/24 0456) Pulse Rate:  [57-93] 93 (11/24 0456) Resp:  [10-24] 16 (11/24 0456) BP: (120-178)/(57-76) 126/65 (11/24 0456) SpO2:  [100 %] 100 % (11/24 0456) Weight:  [57.8 kg] 57.8 kg (11/23 0923)  Intake/Output from previous day:  Intake/Output Summary (Last 24 hours) at 05/29/2019 0729 Last data filed at 05/29/2019 0501 Gross per 24 hour  Intake 2658.42 ml  Output 2570 ml  Net 88.42 ml    Labs: Recent Labs    05/29/19 0337  HGB 12.9   Recent Labs    05/29/19 0337  WBC 11.6*  RBC 4.22  HCT 40.5  PLT 266   Recent Labs    05/29/19 0337  NA 137  K 4.6  CL 108  CO2 15*  BUN 15  CREATININE 0.71  GLUCOSE 210*  CALCIUM 8.6*   Exam: General - Patient is Alert and Oriented Extremity - Neurologically intact Neurovascular intact Sensation intact distally Dorsiflexion/Plantar flexion intact Dressing - dressing C/D/I Motor Function - intact, moving foot and toes well on exam.   Past Medical History:  Diagnosis Date  . Breast cancer ([place])   . Diabetes ([place])    type 2   . High cholesterol   . History of shingles    lefy eye   . Neuropathy   . Osteoarthritis   . Osteoporosis     Assessment/Plan: 1 Day Post-Op Procedure(s) (LRB): Right knee polyethylene exchange  (Right) Principal Problem:   Failed total knee arthroplasty (HCC) Active Problems:   Failed total right knee replacement (HCC)  Estimated body mass index is 21.2 kg/m as  calculated from the following:   Height as of this encounter: 5\' 5"  (1.651 m).   Weight as of this encounter: 57.8 kg. Advance diet Up with therapy D/C IV fluids  DVT Prophylaxis - Xarelto Weight bearing as tolerated. D/C O2 and pulse ox and try on room air. Hemovac pulled without difficulty, will continue therapy.  Plan is to go Home after hospital stay. Plan for discharge later today once meeting goals with physical therapy. Scheduling outpatient physical therapy at [facility]. Follow-up in the office in 2 weeks.   Theresa Duty, PA-C Orthopedic Surgery 05/29/2019, 7:29 AM

## 2019-05-29 NOTE — Progress Notes (Signed)
Physical Therapy Treatment Patient Details Name: Caroline Pratt MRN: [id number] DOB: Mar 08, 1941 Today's Date: 05/29/2019    History of Present Illness Patient is 78 y.o. female s/p Rt TKR with polyexchange on 05/28/19 with PMH significant for osteoporosis, OA, DM, and breast cancer.    PT Comments    POD #1 am session.  Pt was very motivated to get up and try stairs so she could go home. Pt husband was present for treatment. General bed mobility comments: Pt was able to sit up OOB with supervision for safety General transfer comment: Pt was able to remember to push up from the bed. Pt required CGA for steadying and safety General Gait Details: Pt was able to ambulate 25ft with a RW. Pt maintained good postrue and sequencting. General stair comments: Pt and husband were instructed "up with the good, down with the bad" Pt husband was instructed on how to safely guard pt up and down the stairs. Pt was shown TKA HEP and demonstrated understanding of all exercises.   Follow Up Recommendations  Follow surgeon's recommendation for DC plan and follow-up therapies     Equipment Recommendations  Rolling walker with 5" wheels;3in1 (PT)    Recommendations for Other Services       Precautions / Restrictions Precautions Precautions: Fall Restrictions Weight Bearing Restrictions: No    Mobility  Bed Mobility Overal bed mobility: Modified Independent Bed Mobility: Supine to Sit     Supine to sit: Supervision     General bed mobility comments: Pt was able to sit up OOB with supervision for safety  Transfers Overall transfer level: Modified independent Equipment used: Rolling walker (2 wheeled) Transfers: Sit to/from Stand Sit to Stand: Min guard         General transfer comment: Pt was able to remember to push up from the bed. Pt required CGA for steadying and safety  Ambulation/Gait Ambulation/Gait assistance: Min guard Gait Distance (Feet): 20 Feet Assistive device: Rolling  walker (2 wheeled) Gait Pattern/deviations: Step-through pattern;Decreased stride length;Decreased stance time - right     General Gait Details: Pt was able to ambulate 30ft with a RW. Pt maintained good postrue and sequencting.   Stairs Stairs: Yes Stairs assistance: Min guard Stair Management: No rails;With walker;Step to pattern;Forwards Number of Stairs: 4 General stair comments: Pt and husband were instructed "up with the good, down with the bad" Pt husband was instructed on how to safely guard pt up and down the stairs.   Wheelchair Mobility    Modified Rankin (Stroke Patients Only)       Balance                                            Cognition Arousal/Alertness: Awake/alert Behavior During Therapy: WFL for tasks assessed/performed Overall Cognitive Status: Within Functional Limits for tasks assessed                                        Exercises Total Joint Exercises Ankle Circles/Pumps: AROM;15 reps;Seated;Both Quad Sets: AROM;Right;5 reps;Seated Short Arc Quad: AAROM;Right;Seated;5 reps Heel Slides: AAROM;Right;Seated;5 reps Hip ABduction/ADduction: AROM;Right;Seated;5 reps Straight Leg Raises: AROM;Right;Seated;5 reps Long Arc Quad: AROM;Right;Seated;5 reps Knee Flexion: AROM;AAROM;Right;Seated;5 reps    General Comments        Pertinent Vitals/Pain Pain Assessment: 0-10 Pain  Score: 7  Pain Location: Rt knee Pain Descriptors / Indicators: Aching;Sore;Discomfort;Grimacing Pain Intervention(s): Monitored during session;Repositioned;Ice applied    Home Living                      Prior Function            PT Goals (current goals can now be found in the care plan section) Progress towards PT goals: Progressing toward goals    Frequency    7X/week      PT Plan Current plan remains appropriate    Co-evaluation              AM-PAC PT "6 Clicks" Mobility   Outcome Measure  Help  needed turning from your back to your side while in a flat bed without using bedrails?: A Little Help needed moving from lying on your back to sitting on the side of a flat bed without using bedrails?: A Little Help needed moving to and from a bed to a chair (including a wheelchair)?: A Little Help needed standing up from a chair using your arms (e.g., wheelchair or bedside chair)?: None Help needed to walk in hospital room?: A Little Help needed climbing 3-5 steps with a railing? : A Little 6 Click Score: 19    End of Session Equipment Utilized During Treatment: Gait belt Activity Tolerance: Patient tolerated treatment well Patient left: in chair;with call bell/phone within reach;with family/visitor present Nurse Communication: Mobility status;Patient requests pain meds PT Visit Diagnosis: Muscle weakness (generalized) (M62.81);Difficulty in walking, not elsewhere classified (R26.2)     Time: [id number] PT Time Calculation (min) (ACUTE ONLY): 28 min  Charges:  $Gait Training: 8-22 mins $Therapeutic Exercise: 8-22 mins                     Excell Seltzer, [facility]

## 2019-05-29 NOTE — Progress Notes (Signed)
Reviewed d/c instructions with patient and husband. Both verbalized understanding of instructions. Plan to d/c w/ all belongings and equipment once patient done w/ lunch. Will cont to monitor.

## 2019-05-29 NOTE — Plan of Care (Signed)
  Problem: Clinical Measurements: Goal: Ability to maintain clinical measurements within normal limits will improve Outcome: Progressing Goal: Respiratory complications will improve Outcome: Progressing Goal: Cardiovascular complication will be avoided Outcome: Progressing   Problem: Activity: Goal: Risk for activity intolerance will decrease Outcome: Progressing   Problem: Elimination: Goal: Will not experience complications related to urinary retention Outcome: Progressing   Problem: Pain Managment: Goal: General experience of comfort will improve Outcome: Progressing   Problem: Activity: Goal: Ability to avoid complications of mobility impairment will improve Outcome: Progressing Goal: Range of joint motion will improve Outcome: Progressing

## 2019-05-30 ENCOUNTER — Encounter (HOSPITAL_COMMUNITY): Payer: Medicare Other

## 2019-05-30 NOTE — Discharge Summary (Signed)
Physician Discharge Summary   Patient ID: Caroline Pratt MRN: [id number] DOB/AGE: Jan 15, 1941 78 y.o.  Admit date: 05/28/2019 Discharge date: 05/29/2019  Primary Diagnosis: Unstable right total knee arthroplasty  Admission Diagnoses:  Past Medical History:  Diagnosis Date  . Breast cancer ([place])   . Diabetes ([place])    type 2   . High cholesterol   . History of shingles    lefy eye   . Neuropathy   . Osteoarthritis   . Osteoporosis    Discharge Diagnoses:   Principal Problem:   Failed total knee arthroplasty ([place]) Active Problems:   Failed total right knee replacement ([place])  Estimated body mass index is 21.2 kg/m as calculated from the following:   Height as of this encounter: 5\' 5"  (1.651 m).   Weight as of this encounter: 57.8 kg.  Procedure:  Procedure(s) (LRB): Right knee polyethylene exchange  (Right)   Consults: None  HPI: The patient is a 78 year old female who had a right total knee arthroplasty done approximately 10 years ago.  I saw her in second opinion last year.  She was noted to have an unstable right total knee.  She was having pain secondary to the instability.  She had a negative bone scan showing no signs of loosening.  She presents now for polyethylene versus total knee revision.   Laboratory Data: Admission on 05/28/2019, Discharged on 05/29/2019  Component Date Value Ref Range Status  . Glucose-Capillary 05/28/2019 107* 70 - 99 mg/dL Final  . Glucose-Capillary 05/28/2019 70  70 - 99 mg/dL Final  . Glucose-Capillary 05/28/2019 74  70 - 99 mg/dL Final  . Comment 1 05/28/2019 Notify RN   Final  . Glucose-Capillary 05/28/2019 110* 70 - 99 mg/dL Final  . WBC 05/29/2019 11.6* 4.0 - 10.5 K/uL Final  . RBC 05/29/2019 4.22  3.87 - 5.11 MIL/uL Final  . Hemoglobin 05/29/2019 12.9  12.0 - 15.0 g/dL Final  . HCT 05/29/2019 40.5  36.0 - 46.0 % Final  . MCV 05/29/2019 96.0  80.0 - 100.0 fL Final  . MCH 05/29/2019 30.6  26.0 - 34.0 pg Final  . MCHC  05/29/2019 31.9  30.0 - 36.0 g/dL Final  . RDW 05/29/2019 12.4  11.5 - 15.5 % Final  . Platelets 05/29/2019 266  150 - 400 K/uL Final  . nRBC 05/29/2019 0.0  0.0 - 0.2 % Final   Performed at [facility], [place] [address] [place], [place] [address]  . Sodium 05/29/2019 137  135 - 145 mmol/L Final  . Potassium 05/29/2019 4.6  3.5 - 5.1 mmol/L Final  . Chloride 05/29/2019 108  98 - 111 mmol/L Final  . CO2 05/29/2019 15* 22 - 32 mmol/L Final  . Glucose, Bld 05/29/2019 210* 70 - 99 mg/dL Final  . BUN 05/29/2019 15  8 - 23 mg/dL Final  . Creatinine, Ser 05/29/2019 0.71  0.44 - 1.00 mg/dL Final  . Calcium 05/29/2019 8.6* 8.9 - 10.3 mg/dL Final  . GFR calc non Af Amer 05/29/2019 >60  >60 mL/min Final  . GFR calc Af Amer 05/29/2019 >60  >60 mL/min Final  . Anion gap 05/29/2019 14  5 - 15 Final   Performed at [facility], [place] [address], [place], [place] [address]  . Glucose-Capillary 05/28/2019 210* 70 - 99 mg/dL Final  . Glucose-Capillary 05/29/2019 178* 70 - 99 mg/dL Final  . Glucose-Capillary 05/29/2019 210* 70 - 99 mg/dL Final  Hospital Outpatient Visit on 05/24/2019  Component Date  Value Ref Range Status  . SARS-CoV-2, NAA 05/24/2019 NOT DETECTED  NOT DETECTED Final   Comment: (NOTE) This nucleic acid amplification test was developed and its performance characteristics determined by [employer]. Nucleic acid amplification tests include PCR and TMA. This test has not been FDA cleared or approved. This test has been authorized by FDA under an Emergency Use Authorization (EUA). This test is only authorized for the duration of time the declaration that circumstances exist justifying the authorization of the emergency use of in vitro diagnostic tests for detection of SARS-CoV-2 virus and/or diagnosis of COVID-19 infection under section 564(b)(1) of the Act, 21 U.S.C. [id number]) (1), unless the authorization is terminated or revoked sooner.  When diagnostic testing is negative, the possibility of a false negative result should be considered in the context of a patient's recent exposures and the presence of clinical signs and symptoms consistent with COVID-19. An individual without symptoms of COVID- 19 and who is not shedding SARS-CoV-2 vi                          rus would expect to have a negative (not detected) result in this assay. Performed At: [facility] [place], [place] [id number] Rush Farmer MD [id number]   . Coronavirus Source 05/24/2019 NASOPHARYNGEAL   Final   Performed at [facility], [place] [address] [place], [place] [address]  Hospital Outpatient Visit on 05/24/2019  Component Date Value Ref Range Status  . Glucose-Capillary 05/24/2019 194* 70 - 99 mg/dL Final  . Hgb A1c MFr Bld 05/24/2019 7.0* 4.8 - 5.6 % Final   Comment: (NOTE) Pre diabetes:          5.7%-6.4% Diabetes:              >6.4% Glycemic control for   <7.0% adults with diabetes   . Mean Plasma Glucose 05/24/2019 154.2  mg/dL Final   Performed at [place] [address] [place], [place] [address]  . aPTT 05/24/2019 34  24 - 36 seconds Final   Performed at [facility], [place] [address] [place], [place] [address]  . WBC 05/24/2019 6.3  4.0 - 10.5 K/uL Final  . RBC 05/24/2019 4.16  3.87 - 5.11 MIL/uL Final  . Hemoglobin 05/24/2019 12.8  12.0 - 15.0 g/dL Final  . HCT 05/24/2019 39.7  36.0 - 46.0 % Final  . MCV 05/24/2019 95.4  80.0 - 100.0 fL Final  . MCH 05/24/2019 30.8  26.0 - 34.0 pg Final  . MCHC 05/24/2019 32.2  30.0 - 36.0 g/dL Final  . RDW 05/24/2019 12.6  11.5 - 15.5 % Final  . Platelets 05/24/2019 228  150 - 400 K/uL Final  . nRBC 05/24/2019 0.0  0.0 - 0.2 % Final  . Neutrophils Relative % 05/24/2019 73  % Final  . Neutro Abs 05/24/2019 4.6  1.7 - 7.7 K/uL Final  . Lymphocytes Relative 05/24/2019 16  % Final  . Lymphs Abs 05/24/2019 1.0  0.7 - 4.0 K/uL Final  .  Monocytes Relative 05/24/2019 7  % Final  . Monocytes Absolute 05/24/2019 0.5  0.1 - 1.0 K/uL Final  . Eosinophils Relative 05/24/2019 3  % Final  . Eosinophils Absolute 05/24/2019 0.2  0.0 - 0.5 K/uL Final  . Basophils Relative 05/24/2019 1  % Final  . Basophils Absolute 05/24/2019 0.1  0.0 - 0.1 K/uL Final  . Immature Granulocytes 05/24/2019 0  % Final  .  Abs Immature Granulocytes 05/24/2019 0.02  0.00 - 0.07 K/uL Final   Performed at [facility] [address], [place], [place] [address]  . Sodium 05/24/2019 140  135 - 145 mmol/L Final  . Potassium 05/24/2019 4.1  3.5 - 5.1 mmol/L Final  . Chloride 05/24/2019 105  98 - 111 mmol/L Final  . CO2 05/24/2019 27  22 - 32 mmol/L Final  . Glucose, Bld 05/24/2019 229* 70 - 99 mg/dL Final  . BUN 05/24/2019 15  8 - 23 mg/dL Final  . Creatinine, Ser 05/24/2019 0.67  0.44 - 1.00 mg/dL Final  . Calcium 05/24/2019 8.9  8.9 - 10.3 mg/dL Final  . Total Protein 05/24/2019 5.7* 6.5 - 8.1 g/dL Final  . Albumin 05/24/2019 3.3* 3.5 - 5.0 g/dL Final  . AST 05/24/2019 25  15 - 41 U/L Final  . ALT 05/24/2019 21  0 - 44 U/L Final  . Alkaline Phosphatase 05/24/2019 56  38 - 126 U/L Final  . Total Bilirubin 05/24/2019 0.8  0.3 - 1.2 mg/dL Final  . GFR calc non Af Amer 05/24/2019 >60  >60 mL/min Final  . GFR calc Af Amer 05/24/2019 >60  >60 mL/min Final  . Anion gap 05/24/2019 8  5 - 15 Final   Performed at [facility], [place] [address], [place], [place] [address]  . Prothrombin Time 05/24/2019 13.4  11.4 - 15.2 seconds Final  . INR 05/24/2019 1.0  0.8 - 1.2 Final   Comment: (NOTE) INR goal varies based on device and disease states. Performed at [facility], [place] [address] [place], [place] [address]   . ABO/RH(D) 05/24/2019 A POS   Final  . Antibody Screen 05/24/2019 NEG   Final  . Sample Expiration 05/24/2019 05/31/2019,2359   Final  . Extend sample reason 05/24/2019    Final                    Value:NO TRANSFUSIONS OR PREGNANCY IN THE PAST 3 MONTHS Performed at [facility], [place] [address], [place], [place] [address]   . MRSA, PCR 05/24/2019 NEGATIVE  NEGATIVE Final  . Staphylococcus aureus 05/24/2019 NEGATIVE  NEGATIVE Final   Comment: (NOTE) The Xpert SA Assay (FDA approved for NASAL specimens in patients 37 years of age and older), is one component of a comprehensive surveillance program. It is not intended to diagnose infection nor to guide or monitor treatment. Performed at [facility], [place] [address], [place], [place] [address]   . ABO/RH(D) 05/24/2019    Final                   Value:A POS Performed at [facility], [place] [address] [place], [place] [address]      X-Rays:No results found.  EKG: Orders placed or performed during the hospital encounter of 05/24/19  . EKG  . EKG     Hospital Course: NIKITTA WIEDNER is a 78 y.o. who was admitted to [facility]. They were brought to the operating room on 05/28/2019 and underwent Procedure(s): Right knee polyethylene exchange .  Patient tolerated the procedure well and was later transferred to the recovery room and then to the orthopaedic floor for postoperative care. They were given PO and IV analgesics for pain control following their surgery. They were given 24 hours of postoperative antibiotics of  Anti-infectives (From admission, onward)   Start     Dose/Rate Route Frequency Ordered  Stop   05/28/19 1900  ceFAZolin (ANCEF) IVPB 1 g/50 mL premix     1 g 100 mL/hr over 30 Minutes Intravenous Every 6 hours 05/28/19 1603 05/29/19 0037   05/28/19 0915  ceFAZolin (ANCEF) IVPB 2g/100 mL premix     2 g 200 mL/hr over 30 Minutes Intravenous On call to O.R. 05/28/19 [id number] 05/28/19 1317     and started on DVT prophylaxis in the form of Xarelto.   PT and OT were ordered for total joint protocol. Discharge planning consulted to help with postop disposition and  equipment needs.  Patient had a good night on the evening of surgery. They started to get up OOB with therapy on POD #0. Pt was seen during rounds and was ready to go home pending progress with therapy. Hemovac drain was pulled without difficulty. She worked with therapy on POD #1 and was meeting her goals. Pt was discharged to home later that day in stable condition.  Diet: Regular diet Activity: WBAT Follow-up: in 2 weeks Disposition: Home Discharged Condition: good   Discharge Instructions    Call MD / Call 911   Complete by: As directed    If you experience chest pain or shortness of breath, CALL 911 and be transported to the hospital emergency room.  If you develope a fever above 101 F, pus (white drainage) or increased drainage or redness at the wound, or calf pain, call your surgeon's office.   Change dressing   Complete by: As directed    Change dressing on Wednesday, then change the dressing daily with sterile 4 x 4 inch gauze dressing and apply TED hose.   Constipation Prevention   Complete by: As directed    Drink plenty of fluids.  Prune juice may be helpful.  You may use a stool softener, such as Colace (over the counter) 100 mg twice a day.  Use MiraLax (over the counter) for constipation as needed.   Diet - low sodium heart healthy   Complete by: As directed    Do not put a pillow under the knee. Place it under the heel.   Complete by: As directed    Driving restrictions   Complete by: As directed    No driving for two weeks   TED hose   Complete by: As directed    Use stockings (TED hose) for three weeks on both leg(s).  You may remove them at night for sleeping.   Weight bearing as tolerated   Complete by: As directed      Allergies as of 05/29/2019   No Known Allergies     Medication List    STOP taking these medications   Bayer Aspirin EC Low Dose 81 MG EC tablet Generic drug: aspirin   BIOTIN PO   CALCIUM 600/VITAMIN D3 PO   OCUVITE ADULT 50+ PO      TAKE these medications   atorvastatin 10 MG tablet Commonly known as: LIPITOR Take 5 mg by mouth daily.   ezetimibe 10 MG tablet Commonly known as: ZETIA Take 10 mg by mouth daily.   gabapentin 100 MG capsule Commonly known as: NEURONTIN Take 200 mg by mouth 2 (two) times daily.   HumaLOG KwikPen 100 UNIT/ML KiwkPen Generic drug: insulin lispro Inject into the skin 3 (three) times daily. 2 units in the morning, 4 units at lunch, and 6 units at dinner   Jardiance 25 MG Tabs tablet Generic drug: empagliflozin Take 25 mg by mouth daily.   Lantus  SoloStar 100 UNIT/ML Solostar Pen Generic drug: Insulin Glargine Inject 15 Units into the skin daily.   Melatonin 10 MG Tabs Take 2 tablets by mouth at bedtime.   methocarbamol 500 MG tablet Commonly known as: ROBAXIN Take 1 tablet (500 mg total) by mouth every 6 (six) hours as needed for muscle spasms.   oxyCODONE 5 MG immediate release tablet Commonly known as: Oxy IR/ROXICODONE Take 1-2 tablets (5-10 mg total) by mouth every 6 (six) hours as needed for severe pain.   rivaroxaban 10 MG Tabs tablet Commonly known as: XARELTO Take 1 tablet (10 mg total) by mouth daily with breakfast for 20 days. Then resume one 81 mg aspirin once a day.   traMADol 50 MG tablet Commonly known as: ULTRAM Take 1-2 tablets (50-100 mg total) by mouth every 6 (six) hours as needed for moderate pain.   TYLENOL 8 HOUR ARTHRITIS PAIN PO Take 1 tablet by mouth at bedtime.            Discharge Care Instructions  (From admission, onward)         Start     Ordered   05/29/19 0000  Weight bearing as tolerated     05/29/19 0733   05/29/19 0000  Change dressing    Comments: Change dressing on Wednesday, then change the dressing daily with sterile 4 x 4 inch gauze dressing and apply TED hose.   05/29/19 [id number]         Follow-up Information    Gaynelle Arabian, MD. Schedule an appointment as soon as possible for a visit on 06/12/2019.   Specialty:  Orthopedic Surgery Contact information: [address] [place] [address] [id number]           Signed: Griffith Citron, PA-C Orthopedic Surgery 05/30/2019, 8:11 AM

## 2019-06-05 DIAGNOSIS — M25669 Stiffness of unspecified knee, not elsewhere classified: Secondary | ICD-10-CM | POA: Insufficient documentation

## 2019-07-27 ENCOUNTER — Ambulatory Visit: Payer: Medicare Other | Attending: Internal Medicine

## 2019-07-27 DIAGNOSIS — Z23 Encounter for immunization: Secondary | ICD-10-CM

## 2019-07-27 NOTE — Progress Notes (Signed)
   Covid-19 Vaccination Clinic  Name:  Caroline Pratt    MRN: [id number] DOB: 12-Aug-1940  07/27/2019  Caroline Pratt was observed post Covid-19 immunization for 15 minutes without incidence. She was provided with Vaccine Information Sheet and instruction to access the V-Safe system.   Caroline Pratt was instructed to call 911 with any severe reactions post vaccine: [place] Difficulty breathing  . Swelling of your face and throat  . A fast heartbeat  . A bad rash all over your body  . Dizziness and weakness    Immunizations Administered    Name Date Dose VIS Date Route   Pfizer COVID-19 Vaccine 07/27/2019 10:32 AM 0.3 mL 06/15/2019 Intramuscular   Manufacturer: [place]   Lot: [id number]   [place]: [id number]

## 2019-08-17 ENCOUNTER — Ambulatory Visit: Payer: Medicare Other | Attending: Internal Medicine

## 2019-08-17 DIAGNOSIS — Z23 Encounter for immunization: Secondary | ICD-10-CM | POA: Insufficient documentation

## 2019-08-17 NOTE — Progress Notes (Signed)
   Covid-19 Vaccination Clinic  Name:  Caroline Pratt    MRN: [id number] DOB: 09/24/1940  08/17/2019  Caroline Pratt was observed post Covid-19 immunization for 15 minutes without incidence. She was provided with Vaccine Information Sheet and instruction to access the V-Safe system.   Caroline Pratt was instructed to call 911 with any severe reactions post vaccine: [place] Kitchen Difficulty breathing  . Swelling of your face and throat  . A fast heartbeat  . A bad rash all over your body  . Dizziness and weakness    Immunizations Administered    Name Date Dose VIS Date Route   Pfizer COVID-19 Vaccine 08/17/2019 10:21 AM 0.3 mL 06/15/2019 Intramuscular   Manufacturer: [place]   Lot: [id number]   [place]: [id number]

## 2019-10-31 ENCOUNTER — Other Ambulatory Visit (HOSPITAL_COMMUNITY): Payer: Self-pay

## 2019-11-01 ENCOUNTER — Ambulatory Visit (HOSPITAL_COMMUNITY)
Admission: RE | Admit: 2019-11-01 | Discharge: 2019-11-01 | Disposition: A | Discharge status: Home / Self Care | Payer: Medicare Other | Source: Ambulatory Visit | Attending: Endocrinology | Admitting: Endocrinology

## 2019-11-01 ENCOUNTER — Other Ambulatory Visit: Payer: Self-pay

## 2019-11-01 DIAGNOSIS — M810 Age-related osteoporosis without current pathological fracture: Secondary | ICD-10-CM | POA: Diagnosis present

## 2019-11-01 MED ORDER — DENOSUMAB 60 MG/ML ~~LOC~~ SOSY
PREFILLED_SYRINGE | SUBCUTANEOUS | Status: AC
  Filled 2019-11-01: qty 1

## 2019-11-01 MED ORDER — DENOSUMAB 60 MG/ML ~~LOC~~ SOSY
60.0000 mg | PREFILLED_SYRINGE | Freq: Once | SUBCUTANEOUS | Status: AC
  Administered 2019-11-01: 60 mg via SUBCUTANEOUS

## 2019-12-18 ENCOUNTER — Encounter: Payer: Self-pay | Admitting: Sports Medicine

## 2019-12-18 ENCOUNTER — Ambulatory Visit: Payer: Medicare Other | Admitting: Sports Medicine

## 2019-12-18 ENCOUNTER — Other Ambulatory Visit: Payer: Self-pay

## 2019-12-18 VITALS — Temp 96.0°F

## 2019-12-18 DIAGNOSIS — Z872 Personal history of diseases of the skin and subcutaneous tissue: Secondary | ICD-10-CM

## 2019-12-18 DIAGNOSIS — M79674 Pain in right toe(s): Secondary | ICD-10-CM

## 2019-12-18 DIAGNOSIS — B351 Tinea unguium: Secondary | ICD-10-CM

## 2019-12-18 DIAGNOSIS — E119 Type 2 diabetes mellitus without complications: Secondary | ICD-10-CM

## 2019-12-18 DIAGNOSIS — M79675 Pain in left toe(s): Secondary | ICD-10-CM | POA: Diagnosis not present

## 2019-12-18 NOTE — Patient Instructions (Signed)
Soak with epsom salt  (1/4 cup) and warm water for 20 mins once a day for left 1st toe ingrown for the next 3-5 days

## 2019-12-18 NOTE — Progress Notes (Signed)
Subjective: Caroline Pratt is a 79 y.o. female patient with history of diabetes who presents to office today complaining of long,mildly painful nails with some ingrowning at hallux while ambulating in shoes; unable to trim.  Reports that her friend usually helps with trimming. Patient states that the glucose reading this morning was not recorded but last a1c 7.5.  Denies any redness warmth swelling or drainage from bilateral hallux nails but does admit some soreness off and on and some pain that is relieved with Aspercreme spray on to her toes.  Patient denies any other pedal complaints at this time.  Review of symptoms noncontributory  Patient Active Problem List   Diagnosis Date Noted  . 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 Acetaminophen (TYLENOL 8 HOUR ARTHRITIS PAIN PO) Take 1 tablet by mouth at bedtime.    Marland Kitchen atorvastatin (LIPITOR) 10 MG tablet Take 5 mg by mouth daily.     . empagliflozin (JARDIANCE) 25 MG TABS tablet Take 25 mg by mouth daily.    Marland Kitchen ezetimibe (ZETIA) 10 MG tablet Take 10 mg by mouth daily.    Marland Kitchen gabapentin (NEURONTIN) 100 MG capsule Take 200 mg by mouth 2 (two) times daily.     . Insulin Glargine (LANTUS SOLOSTAR) 100 UNIT/ML Solostar Pen Inject 15 Units into the skin daily.     . insulin lispro (HUMALOG KWIKPEN) 100 UNIT/ML KiwkPen Inject into the skin 3 (three) times daily. 2 units in the morning, 4 units at lunch, and 6 units at dinner    . Melatonin 10 MG TABS Take 2 tablets by mouth at bedtime.     No current facility-administered medications on file prior to visit.   No Known Allergies  No results found for this or any previous visit (from the past 2160 hour(s)).  Objective: General: Patient is awake, alert, and oriented x 3 and in no acute distress.  Integument: Skin is warm, dry and supple bilateral.  Nails are tender, long, thickened and dystrophic with subungual debris, consistent with onychomycosis, 1-5 bilateral.  Incurvation bilateral hallux nail borders upon debridement of the left hallux medial border there was some clear drainage noted.  No other signs of infection. No open lesions or preulcerative lesions present bilateral. Remaining integument unremarkable.  Vasculature:  Dorsalis Pedis pulse 1/4 bilateral. Posterior Tibial pulse 1/4 bilateral. Capillary fill time <5 sec 1-5 bilateral.  Scant hair growth to the level of the digits.Temperature gradient within normal limits. No varicosities present bilateral. No edema present bilateral.   Neurology: Gross sensation intact bilateral.   Musculoskeletal: Asymptomatic bunion and hammertoe deformity bilateral.  Pes planus foot type.  No tenderness with calf compression bilateral.  Assessment and Plan: Problem List Items Addressed This Visit    None    Visit Diagnoses    Pain due to onychomycosis of toenails of both feet    -  Primary   History of ingrown nail       Diabetes mellitus without complication ([place])          -Examined patient. -Discussed and educated patient on diabetic foot care, especially with  regards to the vascular, neurological and musculoskeletal systems.  -Stressed the importance of good glycemic control and the detriment of not  controlling glucose levels in relation to the foot. -Mechanically debrided all nails 1-5 bilateral using sterile nail nipper and filed with  dremel without incident  -Applied a small amount of antibiotic cream to the left hallux medial border and advised patient to continue with the same if there is some tenderness and soreness to also soak with warm water Epson salt for the next 3 to 5 days -Advised patient to return office sooner if issues with ingrown's worsen otherwise return as scheduled for routine foot care every 10 to 12 weeks to keep ingrowing corners from growing out into the skin  that could cause a problem.  Landis Martins, DPM

## 2020-02-28 ENCOUNTER — Ambulatory Visit: Payer: Medicare Other | Admitting: Sports Medicine

## 2020-03-13 ENCOUNTER — Encounter: Payer: Self-pay | Admitting: Sports Medicine

## 2020-03-13 ENCOUNTER — Other Ambulatory Visit: Payer: Self-pay

## 2020-03-13 ENCOUNTER — Ambulatory Visit: Payer: Medicare Other | Admitting: Sports Medicine

## 2020-03-13 DIAGNOSIS — E119 Type 2 diabetes mellitus without complications: Secondary | ICD-10-CM | POA: Diagnosis not present

## 2020-03-13 DIAGNOSIS — M79674 Pain in right toe(s): Secondary | ICD-10-CM

## 2020-03-13 DIAGNOSIS — M79675 Pain in left toe(s): Secondary | ICD-10-CM

## 2020-03-13 DIAGNOSIS — B351 Tinea unguium: Secondary | ICD-10-CM | POA: Diagnosis not present

## 2020-03-13 DIAGNOSIS — Z872 Personal history of diseases of the skin and subcutaneous tissue: Secondary | ICD-10-CM

## 2020-03-13 NOTE — Progress Notes (Signed)
Subjective: Caroline Pratt is a 79 y.o. female patient with history of diabetes who presents to office today complaining of long,mildly painful nails with some ingrowning at hallux while ambulating in shoes; unable to trim.  Reports that her friend usually helps with trimming. Patient states that the glucose reading this morning was not recorded but last a1c 7.5 like before.  Denies any redness warmth swelling or drainage from bilateral hallux nails but does admit some soreness off and on and some pain that is relieved with Aspercreme spray on to her toes like previous.  No other issues noted. Patient Active Problem List   Diagnosis Date Noted  . 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 Acetaminophen (TYLENOL 8 HOUR ARTHRITIS PAIN PO) Take 1 tablet by mouth at bedtime.    Marland Kitchen atorvastatin (LIPITOR) 10 MG tablet Take 5 mg by mouth daily.     . empagliflozin (JARDIANCE) 25 MG TABS tablet Take 25 mg by mouth daily.    Marland Kitchen ezetimibe (ZETIA) 10 MG tablet Take 10 mg by mouth daily.    Marland Kitchen gabapentin (NEURONTIN) 100 MG capsule Take 200 mg by mouth 2 (two) times daily.     . Insulin Glargine (LANTUS SOLOSTAR) 100 UNIT/ML Solostar Pen Inject 15 Units into the skin daily.     . insulin lispro (HUMALOG KWIKPEN) 100 UNIT/ML KiwkPen Inject into the skin 3 (three) times daily. 2 units in the morning, 4 units at lunch, and 6 units at dinner    . Melatonin 10 MG TABS Take 2 tablets by mouth at bedtime.    Marland Kitchen tiZANidine (ZANAFLEX) 4 MG tablet tizanidine 4 mg tablet     No current facility-administered medications on file prior to visit.   No Known Allergies  No results found for this or any previous visit (from the past 2160 hour(s)).  Objective: General: Patient is awake, alert, and oriented x 3 and in no acute distress.  Integument: Skin is warm, dry and  supple bilateral. Nails are tender, long, thickened and dystrophic with subungual debris, consistent with onychomycosis, 1-5 bilateral.  Incurvation bilateral hallux nail borders upon debridement of the left hallux medial border there was some clear drainage noted.  No other signs of infection. No open lesions or preulcerative lesions present bilateral. Remaining integument unremarkable.  Vasculature:  Dorsalis Pedis pulse 1/4 bilateral. Posterior Tibial pulse 1/4 bilateral. Capillary fill time <5 sec 1-5 bilateral.  Scant hair growth to the level of the digits.Temperature gradient within normal limits. No varicosities present bilateral. No edema present bilateral.   Neurology: Gross sensation intact bilateral.   Musculoskeletal: Asymptomatic bunion and hammertoe deformity bilateral.  Pes planus foot type.  No tenderness with calf compression bilateral.  Assessment and Plan: Problem List Items Addressed This Visit    None    Visit Diagnoses    Pain due to onychomycosis of toenails of both feet    -  Primary   History of ingrown nail       Diabetes mellitus without complication ([place])          -Examined patient. -Re-Discussed and educated patient on diabetic foot care, especially with  regards to the vascular, neurological and musculoskeletal systems. -Mechanically debrided all nails 1-5 bilateral using sterile nail nipper and filed with dremel without incident  -Applied a bandaid and a small amount of antibiotic cream to the  left hallux medial border and advised patient to continue with the same if there is some tenderness and soreness to also soak with warm water Epson salt for the next 3 to 5 days like previous -Advised patient to return office sooner if issues with ingrown's worsen otherwise return as scheduled for routine foot care every 10 to 12 weeks to keep ingrowing corners trimmed back.  Landis Martins, DPM

## 2020-05-16 ENCOUNTER — Other Ambulatory Visit (HOSPITAL_COMMUNITY): Payer: Self-pay | Admitting: *Deleted

## 2020-05-19 ENCOUNTER — Ambulatory Visit (HOSPITAL_COMMUNITY)
Admission: RE | Admit: 2020-05-19 | Discharge: 2020-05-19 | Disposition: A | Discharge status: Home / Self Care | Payer: Medicare Other | Source: Ambulatory Visit | Attending: Endocrinology | Admitting: Endocrinology

## 2020-05-19 ENCOUNTER — Other Ambulatory Visit: Payer: Self-pay

## 2020-05-19 DIAGNOSIS — M810 Age-related osteoporosis without current pathological fracture: Secondary | ICD-10-CM | POA: Diagnosis present

## 2020-05-19 MED ORDER — DENOSUMAB 60 MG/ML ~~LOC~~ SOSY: 60.0000 mg | PREFILLED_SYRINGE | Freq: Once | SUBCUTANEOUS | Status: DC

## 2020-05-19 MED ORDER — DENOSUMAB 60 MG/ML ~~LOC~~ SOSY
PREFILLED_SYRINGE | SUBCUTANEOUS | Status: AC
  Administered 2020-05-19: 60 mg
  Filled 2020-05-19: qty 1

## 2020-06-12 ENCOUNTER — Encounter: Payer: Self-pay | Admitting: Sports Medicine

## 2020-06-12 ENCOUNTER — Ambulatory Visit: Payer: Medicare Other | Admitting: Sports Medicine

## 2020-06-12 ENCOUNTER — Other Ambulatory Visit: Payer: Self-pay

## 2020-06-12 DIAGNOSIS — M79674 Pain in right toe(s): Secondary | ICD-10-CM | POA: Diagnosis not present

## 2020-06-12 DIAGNOSIS — E119 Type 2 diabetes mellitus without complications: Secondary | ICD-10-CM | POA: Diagnosis not present

## 2020-06-12 DIAGNOSIS — M79675 Pain in left toe(s): Secondary | ICD-10-CM

## 2020-06-12 DIAGNOSIS — B351 Tinea unguium: Secondary | ICD-10-CM

## 2020-06-12 DIAGNOSIS — Z872 Personal history of diseases of the skin and subcutaneous tissue: Secondary | ICD-10-CM

## 2020-06-12 NOTE — Progress Notes (Signed)
Subjective: Caroline Pratt is a 79 y.o. female patient with history of diabetes who presents to office today complaining of long,mildly painful nails with some ingrowning at hallux while ambulating in shoes; unable to trim.  Reports that her toes feel a little sore denies redness warmth swelling drainage or any other constitutional symptoms at this time.  Fasting blood sugar not recorded.  Visit to PCP over a month ago.  No other issues noted. Patient Active Problem List   Diagnosis Date Noted  . Decreased range of knee movement 06/05/2019  . 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 Unstable knee 05/07/2019  . Degeneration of lumbar intervertebral disc 03/15/2019  . Osteoporosis 03/15/2019  . Pain in right knee 09/26/2017   Current Outpatient Medications on File Prior to Visit  Medication Sig Dispense Refill  . Acetaminophen (TYLENOL 8 HOUR ARTHRITIS PAIN PO) Take 1 tablet by mouth at bedtime.    Marland Kitchen atorvastatin (LIPITOR) 10 MG tablet Take 5 mg by mouth daily.     . empagliflozin (JARDIANCE) 25 MG TABS tablet Take 25 mg by mouth daily.    Marland Kitchen ezetimibe (ZETIA) 10 MG tablet Take 10 mg by mouth daily.    Marland Kitchen gabapentin (NEURONTIN) 100 MG capsule Take 200 mg by mouth 2 (two) times daily.     . insulin glargine (LANTUS) 100 UNIT/ML Solostar Pen Inject 15 Units into the skin daily.     . insulin lispro (HUMALOG) 100 UNIT/ML KiwkPen Inject into the skin 3 (three) times daily. 2 units in the morning, 4 units at lunch, and 6 units at dinner    . Melatonin 10 MG TABS Take 2 tablets by mouth at bedtime.    Marland Kitchen tiZANidine (ZANAFLEX) 4 MG tablet tizanidine 4 mg tablet     No current facility-administered medications on file prior to visit.   No Known Allergies  No results found for this or any previous visit (from the past 2160 hour(s)).  Objective: General: Patient is awake, alert, and oriented x 3 and in no acute  distress.  Integument: Skin is warm, dry and supple bilateral. Nails are tender, long, thickened and dystrophic with subungual debris, consistent with onychomycosis, 1-5 bilateral.  Incurvation bilateral hallux nail borders with no acute signs of infection. No open lesions or preulcerative lesions present bilateral. Remaining integument unremarkable.  Vasculature:  Dorsalis Pedis pulse 1/4 bilateral. Posterior Tibial pulse 1/4 bilateral. Capillary fill time <5 sec 1-5 bilateral.  Scant hair growth to the level of the digits.Temperature gradient within normal limits. No varicosities present bilateral. No edema present bilateral.   Neurology: Gross sensation intact bilateral.   Musculoskeletal: Asymptomatic bunion and hammertoe deformity bilateral.  Pes planus foot type.  No tenderness with calf compression bilateral.  Assessment and Plan: Problem List Items Addressed This Visit   None   Visit Diagnoses    Pain due to onychomycosis of toenails of both feet    -  Primary   History of ingrown nail       Diabetes mellitus without complication ([place])          -Examined patient. -Re-Discussed and educated patient on diabetic foot care, especially with  regards to the vascular, neurological and musculoskeletal systems. -Mechanically debrided all nails 1-5 bilateral using sterile nail nipper and filed with dremel without incident  -No acute ingrowing noted at today's visit -Return for routine foot care every 62 days weeks to keep ingrowing corners trimmed back.  Landis Martins, DPM

## 2020-08-01 DIAGNOSIS — S52502A Unspecified fracture of the lower end of left radius, initial encounter for closed fracture: Secondary | ICD-10-CM | POA: Insufficient documentation

## 2020-08-19 ENCOUNTER — Other Ambulatory Visit: Payer: Self-pay | Admitting: Endocrinology

## 2020-08-19 DIAGNOSIS — R413 Other amnesia: Secondary | ICD-10-CM

## 2020-09-11 ENCOUNTER — Encounter: Payer: Self-pay | Admitting: Sports Medicine

## 2020-09-11 ENCOUNTER — Other Ambulatory Visit: Payer: Self-pay

## 2020-09-11 ENCOUNTER — Ambulatory Visit: Payer: Medicare Other | Admitting: Sports Medicine

## 2020-09-11 DIAGNOSIS — L600 Ingrowing nail: Secondary | ICD-10-CM

## 2020-09-11 DIAGNOSIS — E119 Type 2 diabetes mellitus without complications: Secondary | ICD-10-CM | POA: Diagnosis not present

## 2020-09-11 DIAGNOSIS — B351 Tinea unguium: Secondary | ICD-10-CM | POA: Diagnosis not present

## 2020-09-11 DIAGNOSIS — S42309S Unspecified fracture of shaft of humerus, unspecified arm, sequela: Secondary | ICD-10-CM | POA: Insufficient documentation

## 2020-09-11 DIAGNOSIS — K5900 Constipation, unspecified: Secondary | ICD-10-CM | POA: Insufficient documentation

## 2020-09-11 DIAGNOSIS — M79674 Pain in right toe(s): Secondary | ICD-10-CM

## 2020-09-11 DIAGNOSIS — M79675 Pain in left toe(s): Secondary | ICD-10-CM

## 2020-09-11 DIAGNOSIS — R413 Other amnesia: Secondary | ICD-10-CM | POA: Insufficient documentation

## 2020-09-11 DIAGNOSIS — R911 Solitary pulmonary nodule: Secondary | ICD-10-CM | POA: Insufficient documentation

## 2020-09-11 MED ORDER — NEOMYCIN-POLYMYXIN-HC 3.5-10000-1 OT SOLN: OTIC | 0 refills | Status: AC

## 2020-09-11 NOTE — Progress Notes (Signed)
Subjective: Caroline Pratt is a 80 y.o. female patient with history of diabetes who presents to office today complaining of long,mildly painful nails with some ingrowning at hallux especially the left medial border while ambulating in shoes; unable to trim.  Reports that her toes feel a little sore so she has been using over-the-counter pain rub on them denies any active drainage redness or warmth.  No other acute symptoms noted.  Fasting blood sugar not recorded   Patient Active Problem List   Diagnosis Date Noted  . Constipation 09/11/2020  . Late effect of fracture of upper extremity 09/11/2020  . Memory impairment 09/11/2020  . Pulmonary nodule 09/11/2020  . Closed fracture of distal end of left radius 08/01/2020  . Decreased range of knee movement 06/05/2019  . 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 Unstable knee 05/07/2019  . Hardening of the aorta (main artery of the heart) ([place]) 04/16/2019  . Degeneration of lumbar intervertebral disc 03/15/2019  . Osteoporosis 03/15/2019  . 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 Age-related osteoporosis without current pathological fracture 06/15/2018  . Melanocytic nevi, unspecified 03/03/2018  . Pain in right knee 09/26/2017  . Long term (current) use of insulin ([place]) 06/13/2017  . Hereditary and idiopathic neuropathy, unspecified 08/17/2016  . Benign paroxysmal positional vertigo 05/09/2015  . Malignant neoplasm of female breast ([place]) 11/28/2014  . Personal history of malignant neoplasm of breast 11/28/2014  . Encounter for general adult medical examination without abnormal findings 11/26/2014  . Hyperlipidemia 12/05/2013  . Non-toxic multinodular goiter 12/05/2013   Current Outpatient Medications on File Prior to Visit  Medication Sig Dispense Refill  . Acetaminophen (TYLENOL 8 HOUR ARTHRITIS PAIN PO) Take 1 tablet by mouth at  bedtime.    Marland Kitchen aspirin 81 MG EC tablet Take 1 tab po qd    . atorvastatin (LIPITOR) 10 MG tablet Take 5 mg by mouth daily.     . Continuous Blood Gluc Sensor (FREESTYLE LIBRE 2 SENSOR) MISC CHANGE EVERY 14 DAYS TO MONITOR BLOOD GLUCOSE CONTINUOUSLY    . cyclobenzaprine (FLEXERIL) 5 MG tablet cyclobenzaprine 5 mg tablet    . denosumab (PROLIA) 60 MG/ML SOSY injection Inject 60mg  every 6 months (start February 2020)    . Docusate Sodium (DSS) 100 MG CAPS docusate sodium 100 mg capsule  TAKE 1 CAPSULE EVERY DAY BY ORAL ROUTE FOR 15 DAYS.    Marland Kitchen empagliflozin (JARDIANCE) 25 MG TABS tablet Take 25 mg by mouth daily.    Marland Kitchen ezetimibe (ZETIA) 10 MG tablet Take 10 mg by mouth daily.    Marland Kitchen gabapentin (NEURONTIN) 100 MG capsule Take 200 mg by mouth 2 (two) times daily.     . insulin glargine (LANTUS) 100 UNIT/ML Solostar Pen Inject 15 Units into the skin daily.     . insulin lispro (HUMALOG) 100 UNIT/ML KiwkPen Inject into the skin 3 (three) times daily. 2 units in the morning, 4 units at lunch, and 6 units at dinner    . Insulin Pen Needle (B-D UF III MINI PEN NEEDLES) 31G X 5 MM MISC use as directed with insulin pens 4 times daily, DX: E11.9    . Melatonin 10 MG TABS Take 2 tablets by mouth at bedtime.    . methocarbamol (ROBAXIN) 500 MG tablet methocarbamol 500 mg tablet  TAKE 1 TABLET TWICE A DAY AN ORALLY AS DIRECTED 30 DAYS    . Multiple Vitamins-Minerals (OCUVITE EXTRA)  TABS one po qd    . naproxen sodium (ALEVE) 220 MG tablet 1 tablet at bedtime as needed    . nitrofurantoin, macrocrystal-monohydrate, (MACROBID) 100 MG capsule Take 100 mg by mouth 2 (two) times daily.    Marland Kitchen oxyCODONE-acetaminophen (PERCOCET/ROXICET) 5-325 MG tablet oxycodone-acetaminophen 5 mg-325 mg tablet  TAKE 1 TABLET BY MOUTH EVERY 6 HOURS AS NEEDED FOR 5 DAYS    . tiZANidine (ZANAFLEX) 4 MG tablet tizanidine 4 mg tablet     No current facility-administered medications on file prior to visit.   Allergies  Allergen Reactions   . Abaloparatide Other (See Comments)  . Metformin Hcl     Other reaction(s): dry mouth    No results found for this or any previous visit (from the past 2160 hour(s)).  Objective: General: Patient is awake, alert, and oriented x 3 and in no acute distress.  Integument: Skin is warm, dry and supple bilateral.  Elongated nails at bilateral hallux nail borders with incurvation at the left medial aspect no active drainage, no open lesions or preulcerative lesions present bilateral. Remaining integument unremarkable.  Vasculature:  Dorsalis Pedis pulse 1/4 bilateral. Posterior Tibial pulse 1/4 bilateral. Capillary fill time <5 sec 1-5 bilateral.  Scant hair growth to the level of the digits.Temperature gradient within normal limits. No varicosities present bilateral. No edema present bilateral.   Neurology: Gross sensation intact bilateral.   Musculoskeletal: Asymptomatic bunion and hammertoe deformity bilateral.  Pes planus foot type.  No tenderness with calf compression bilateral.  Assessment and Plan: Problem List Items Addressed This Visit   None   Visit Diagnoses    Ingrowing nail    -  Primary   Pain due to onychomycosis of toenails of both feet       Relevant Medications   nitrofurantoin, macrocrystal-monohydrate, (MACROBID) 100 MG capsule   Diabetes mellitus without complication ([facility])       Relevant Medications   aspirin 81 MG EC tablet      -Examined patient. -Re-Discussed and educated patient on diabetic foot care, especially with  regards to the vascular, neurological and musculoskeletal systems. -Mechanically debrided bilateral hallux nail  using sterile nail nipper and filed with dremel without incident  -Due to increased soreness advised patient to soak with Epson salt and apply Corticosporin to the left hallux toenail if fails to continue to improve to return to office for possible ingrown nail procedure however at this time due to no acute infection at this nail  border the nail was only trimmed -Return if fails to continue to improve Landis Martins, DPM

## 2020-09-24 ENCOUNTER — Encounter (HOSPITAL_COMMUNITY): Payer: Medicare Other

## 2020-09-25 ENCOUNTER — Other Ambulatory Visit: Payer: Self-pay

## 2020-09-25 ENCOUNTER — Ambulatory Visit: Payer: Medicare Other | Admitting: Sports Medicine

## 2020-09-25 ENCOUNTER — Encounter: Payer: Self-pay | Admitting: Sports Medicine

## 2020-09-25 DIAGNOSIS — E119 Type 2 diabetes mellitus without complications: Secondary | ICD-10-CM

## 2020-09-25 DIAGNOSIS — Z872 Personal history of diseases of the skin and subcutaneous tissue: Secondary | ICD-10-CM

## 2020-09-25 NOTE — Progress Notes (Signed)
Subjective: Caroline Pratt is a 80 y.o. female patient returns to office today for follow up evaluation after having left 1st toenail trimmed at the left hallux medial border for pain at an ingrown nail.  Patient reports that the toe is doing much better no pain no redness the Epson salt seem to help.  Patient Active Problem List   Diagnosis Date Noted  . Constipation 09/11/2020  . Late effect of fracture of upper extremity 09/11/2020  . Memory impairment 09/11/2020  . Pulmonary nodule 09/11/2020  . Closed fracture of distal end of left radius 08/01/2020  . Decreased range of knee movement 06/05/2019  . 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 Unstable knee 05/07/2019  . Hardening of the aorta (main artery of the heart) ([place]) 04/16/2019  . Degeneration of lumbar intervertebral disc 03/15/2019  . Osteoporosis 03/15/2019  . 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 Age-related osteoporosis without current pathological fracture 06/15/2018  . Melanocytic nevi, unspecified 03/03/2018  . Pain in right knee 09/26/2017  . Long term (current) use of insulin ([place]) 06/13/2017  . Hereditary and idiopathic neuropathy, unspecified 08/17/2016  . Benign paroxysmal positional vertigo 05/09/2015  . Malignant neoplasm of female breast ([place]) 11/28/2014  . Personal history of malignant neoplasm of breast 11/28/2014  . Encounter for general adult medical examination without abnormal findings 11/26/2014  . Hyperlipidemia 12/05/2013  . Non-toxic multinodular goiter 12/05/2013    Current Outpatient Medications on File Prior to Visit  Medication Sig Dispense Refill  . Acetaminophen (TYLENOL 8 HOUR ARTHRITIS PAIN PO) Take 1 tablet by mouth at bedtime.    Marland Kitchen aspirin 81 MG EC tablet Take 1 tab po qd    . atorvastatin (LIPITOR) 10 MG tablet Take 5 mg by mouth daily.     . Continuous Blood Gluc Sensor (FREESTYLE  LIBRE 2 SENSOR) MISC CHANGE EVERY 14 DAYS TO MONITOR BLOOD GLUCOSE CONTINUOUSLY    . cyclobenzaprine (FLEXERIL) 5 MG tablet cyclobenzaprine 5 mg tablet    . denosumab (PROLIA) 60 MG/ML SOSY injection Inject 60mg  every 6 months (start February 2020)    . Docusate Sodium (DSS) 100 MG CAPS docusate sodium 100 mg capsule  TAKE 1 CAPSULE EVERY DAY BY ORAL ROUTE FOR 15 DAYS.    Marland Kitchen empagliflozin (JARDIANCE) 25 MG TABS tablet Take 25 mg by mouth daily.    Marland Kitchen ezetimibe (ZETIA) 10 MG tablet Take 10 mg by mouth daily.    Marland Kitchen gabapentin (NEURONTIN) 100 MG capsule Take 200 mg by mouth 2 (two) times daily.     . insulin glargine (LANTUS) 100 UNIT/ML Solostar Pen Inject 15 Units into the skin daily.     . insulin lispro (HUMALOG) 100 UNIT/ML KiwkPen Inject into the skin 3 (three) times daily. 2 units in the morning, 4 units at lunch, and 6 units at dinner    . Insulin Pen Needle (B-D UF III MINI PEN NEEDLES) 31G X 5 MM MISC use as directed with insulin pens 4 times daily, DX: E11.9    . Melatonin 10 MG TABS Take 2 tablets by mouth at bedtime.    . methocarbamol (ROBAXIN) 500 MG tablet methocarbamol 500 mg tablet  TAKE 1 TABLET TWICE A DAY AN ORALLY AS DIRECTED 30 DAYS    . Multiple Vitamins-Minerals (OCUVITE EXTRA) TABS one po qd    . naproxen sodium (ALEVE) 220 MG tablet 1 tablet at bedtime as needed    .  neomycin-polymyxin-hydrocortisone (CORTISPORIN) OTIC solution Apply 1-2 drops to toe after soaking once daily 10 mL 0  . nitrofurantoin, macrocrystal-monohydrate, (MACROBID) 100 MG capsule Take 100 mg by mouth 2 (two) times daily.    Marland Kitchen oxyCODONE-acetaminophen (PERCOCET/ROXICET) 5-325 MG tablet oxycodone-acetaminophen 5 mg-325 mg tablet  TAKE 1 TABLET BY MOUTH EVERY 6 HOURS AS NEEDED FOR 5 DAYS    . tiZANidine (ZANAFLEX) 4 MG tablet tizanidine 4 mg tablet     No current facility-administered medications on file prior to visit.    Allergies  Allergen Reactions  . Abaloparatide Other (See Comments)  .  Metformin Hcl     Other reaction(s): dry mouth    Objective:  General: Well developed, nourished, in no acute distress, alert and oriented x3   Dermatology: Skin is warm, dry and supple bilateral.  Previous ingrown has been resolved at the left hallux medial nail border.  No acute redness warmth swelling or drainage.  Neurovascular status: Intact. No lower extremity swelling; No pain with calf compression bilateral.  Musculoskeletal: No tenderness to palpation at left hallux unchanged significant bunion deformity with crossover.  Assesement and Plan: Problem List Items Addressed This Visit   None   Visit Diagnoses    History of ingrown nail    -  Primary   Diabetes mellitus without complication ([place])          -Examined patient  -Left hallux nail well-healed with no acute signs or symptoms of infection -Educated patient on signs and symptoms of reoccurrence of ingrown toenail advised patient to monitor and if symptoms recur to come to office sooner otherwise to continue on a schedule of coming every 10 weeks for diabetic nail care  Landis Martins, DPM

## 2020-10-16 ENCOUNTER — Encounter (HOSPITAL_BASED_OUTPATIENT_CLINIC_OR_DEPARTMENT_OTHER): Payer: Self-pay | Admitting: *Deleted

## 2020-10-16 ENCOUNTER — Emergency Department (HOSPITAL_BASED_OUTPATIENT_CLINIC_OR_DEPARTMENT_OTHER)
Admission: EM | Admit: 2020-10-16 | Discharge: 2020-10-17 | Disposition: A | Discharge status: Home / Self Care | Payer: Medicare Other | Attending: Emergency Medicine | Admitting: Emergency Medicine

## 2020-10-16 ENCOUNTER — Other Ambulatory Visit: Payer: Self-pay

## 2020-10-16 ENCOUNTER — Emergency Department (HOSPITAL_BASED_OUTPATIENT_CLINIC_OR_DEPARTMENT_OTHER): Payer: Medicare Other

## 2020-10-16 DIAGNOSIS — Z96651 Presence of right artificial knee joint: Secondary | ICD-10-CM | POA: Diagnosis not present

## 2020-10-16 DIAGNOSIS — M25562 Pain in left knee: Secondary | ICD-10-CM | POA: Diagnosis present

## 2020-10-16 DIAGNOSIS — Z7982 Long term (current) use of aspirin: Secondary | ICD-10-CM | POA: Diagnosis not present

## 2020-10-16 DIAGNOSIS — E1042 Type 1 diabetes mellitus with diabetic polyneuropathy: Secondary | ICD-10-CM | POA: Diagnosis not present

## 2020-10-16 DIAGNOSIS — Z794 Long term (current) use of insulin: Secondary | ICD-10-CM | POA: Diagnosis not present

## 2020-10-16 DIAGNOSIS — Z853 Personal history of malignant neoplasm of breast: Secondary | ICD-10-CM | POA: Diagnosis not present

## 2020-10-16 LAB — CBG MONITORING, ED: Glucose-Capillary: 115 mg/dL — ABNORMAL HIGH (ref 70–99)

## 2020-10-16 MED ORDER — ACETAMINOPHEN 325 MG PO TABS
650.0000 mg | ORAL_TABLET | Freq: Once | ORAL | Status: AC
  Administered 2020-10-16: 650 mg via ORAL
  Filled 2020-10-16: qty 2

## 2020-10-16 MED ORDER — ACETAMINOPHEN ER 650 MG PO TBCR: 650.0000 mg | EXTENDED_RELEASE_TABLET | Freq: Three times a day (TID) | ORAL | 0 refills | Status: AC | PRN

## 2020-10-16 MED ORDER — PREDNISONE 10 MG PO TABS: 20.0000 mg | ORAL_TABLET | Freq: Two times a day (BID) | ORAL | 0 refills | Status: AC

## 2020-10-16 MED ORDER — OXYCODONE-ACETAMINOPHEN 5-325 MG PO TABS
1.0000 | ORAL_TABLET | Freq: Once | ORAL | Status: AC
  Administered 2020-10-16: 1 via ORAL
  Filled 2020-10-16: qty 1

## 2020-10-16 NOTE — ED Triage Notes (Signed)
Left knee pain x 2 hours. No injury.

## 2020-10-16 NOTE — ED Provider Notes (Signed)
[facility] Provider Note   CSN: [id number] Arrival date & time: 10/16/20  2133     History Chief Complaint  Patient presents with  . Knee Pain    Caroline Pratt is a 80 y.o. female.  HPI Patient is 80 year old female with past medical history significant for DM 2 which she states is well controlled, osteoarthritis of both knees she has had surgery on the right knee.  She has not had surgery or any arthrocentesis on the left.  She states that she does have chronic knee pain but states that it became significantly worse today.  No injury.  She denies any falls or recent injuries.  She denies any easy bleeding or bruising.   The pain is been ongoing for approximately 6 hours at this time. She states is achy constant worse with movement.  She states she is able to walk.  No other associate symptoms.  No fevers chills redness swelling or warmth to touch.     Past Medical History:  Diagnosis Date  . Breast cancer ([place])   . Diabetes ([place])    type 2   . High cholesterol   . History of shingles    lefy eye   . Neuropathy   . Osteoarthritis   . Osteoporosis     Patient Active Problem List   Diagnosis Date Noted  . Constipation 09/11/2020  . Late effect of fracture of upper extremity 09/11/2020  . Memory impairment 09/11/2020  . Pulmonary nodule 09/11/2020  . Closed fracture of distal end of left radius 08/01/2020  . Decreased range of knee movement 06/05/2019  . 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 Unstable knee 05/07/2019  . Hardening of the aorta (main artery of the heart) ([place]) 04/16/2019  . Degeneration of lumbar intervertebral disc 03/15/2019  . Osteoporosis 03/15/2019  . 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 Age-related osteoporosis without current pathological fracture 06/15/2018  . Melanocytic nevi, unspecified 03/03/2018  .  Pain in right knee 09/26/2017  . Long term (current) use of insulin ([place]) 06/13/2017  . Hereditary and idiopathic neuropathy, unspecified 08/17/2016  . Benign paroxysmal positional vertigo 05/09/2015  . Malignant neoplasm of female breast ([place]) 11/28/2014  . Personal history of malignant neoplasm of breast 11/28/2014  . Encounter for general adult medical examination without abnormal findings 11/26/2014  . Hyperlipidemia 12/05/2013  . Non-toxic multinodular goiter 12/05/2013    Past Surgical History:  Procedure Laterality Date  . EYE SURGERY     shingles in the eye  ,   . MASTECTOMY Left 80 yo  . REPLACEMENT TOTAL KNEE Right 2009  . TOTAL KNEE REVISION Right 05/28/2019   Procedure: Right knee polyethylene exchange ;  Surgeon: Gaynelle Arabian, MD;  Location: [facility];  Service: Orthopedics;  Laterality: Right;  174min     OB History   No obstetric history on file.     Family History  Problem Relation Age of Onset  . Stroke Mother   . Heart attack Father     Social History   Tobacco Use  . Smoking status: Never Smoker  . Smokeless tobacco: Never Used  Vaping Use  . Vaping Use: Never used  Substance Use Topics  . Alcohol use: Never  . Drug use: Never    Home Medications Prior to Admission medications   Medication Sig Start Date End Date Taking? Authorizing Provider  acetaminophen (TYLENOL 8  HOUR ARTHRITIS PAIN) 650 MG CR tablet Take 1 tablet (650 mg total) by mouth every 8 (eight) hours as needed for up to 14 days for pain. 10/16/20 10/30/20 Yes Obie Kallenbach S, PA  predniSONE (DELTASONE) 10 MG tablet Take 2 tablets (20 mg total) by mouth 2 (two) times daily with a meal for 3 days. 10/16/20 10/19/20 Yes Tedd Sias, PA  aspirin 81 MG EC tablet Take 1 tab po qd 12/05/13   [provider]  atorvastatin (LIPITOR) 10 MG tablet Take 5 mg by mouth daily.     [provider]  Continuous Blood Gluc Sensor (FREESTYLE LIBRE 2 SENSOR) MISC CHANGE EVERY 14 DAYS TO  MONITOR BLOOD GLUCOSE CONTINUOUSLY 07/08/20   [provider]  cyclobenzaprine (FLEXERIL) 5 MG tablet cyclobenzaprine 5 mg tablet    [provider]  denosumab (PROLIA) 60 MG/ML SOSY injection Inject 60mg  every 6 months (start February 2020) 06/21/18   [provider]  Docusate Sodium (DSS) 100 MG CAPS docusate sodium 100 mg capsule  TAKE 1 CAPSULE EVERY DAY BY ORAL ROUTE FOR 15 DAYS.    [provider]  empagliflozin (JARDIANCE) 25 MG TABS tablet Take 25 mg by mouth daily.    [provider]  ezetimibe (ZETIA) 10 MG tablet Take 10 mg by mouth daily. 05/25/19   [provider]  gabapentin (NEURONTIN) 100 MG capsule Take 200 mg by mouth 2 (two) times daily.  04/11/19   [provider]  insulin glargine (LANTUS) 100 UNIT/ML Solostar Pen Inject 15 Units into the skin daily.     [provider]  insulin lispro (HUMALOG) 100 UNIT/ML KiwkPen Inject into the skin 3 (three) times daily. 2 units in the morning, 4 units at lunch, and 6 units at dinner    [provider]  Insulin Pen Needle (B-D UF III MINI PEN NEEDLES) 31G X 5 MM MISC use as directed with insulin pens 4 times daily, DX: E11.9 01/10/14   [provider]  Melatonin 10 MG TABS Take 2 tablets by mouth at bedtime.    [provider]  methocarbamol (ROBAXIN) 500 MG tablet methocarbamol 500 mg tablet  TAKE 1 TABLET TWICE A DAY AN ORALLY AS DIRECTED 30 DAYS 09/02/20   [provider]  Multiple Vitamins-Minerals (OCUVITE EXTRA) TABS one po qd 12/05/13   [provider]  naproxen sodium (ALEVE) 220 MG tablet 1 tablet at bedtime as needed 02/21/20   [provider]  neomycin-polymyxin-hydrocortisone (CORTISPORIN) OTIC solution Apply 1-2 drops to toe after soaking once daily 09/11/20   Landis Martins, DPM  nitrofurantoin, macrocrystal-monohydrate, (MACROBID) 100 MG capsule Take 100 mg by mouth 2 (two) times daily. 09/10/20   [provider]  oxyCODONE-acetaminophen (PERCOCET/ROXICET) 5-325 MG tablet oxycodone-acetaminophen 5 mg-325 mg tablet  TAKE 1 TABLET BY MOUTH EVERY 6 HOURS AS NEEDED FOR 5 DAYS    [provider]  tiZANidine (ZANAFLEX) 4 MG tablet tizanidine 4 mg tablet    [provider]    Allergies    Abaloparatide and Metformin hcl  Review of Systems   Review of Systems  Constitutional: Negative for fever.  HENT: Negative for congestion.   Respiratory: Negative for shortness of breath.   Cardiovascular: Negative for chest pain.  Gastrointestinal: Negative for abdominal distention.  Musculoskeletal:       Left knee pain  Neurological: Negative for dizziness and headaches.    Physical Exam Updated Vital Signs BP 133/67   Pulse 62   Temp  97.8 F (36.6 C) (Oral)   Resp 17   Ht 5\' 5"  (1.651 m)   Wt 57.8 kg   SpO2 97%   BMI 21.20 kg/m   Physical Exam Vitals and nursing note reviewed.  Constitutional:      General: She is not in acute distress.    Appearance: Normal appearance. She is not ill-appearing.  HENT:     Head: Normocephalic and atraumatic.  Eyes:     General: No scleral icterus.       Right eye: No discharge.        Left eye: No discharge.     Conjunctiva/sclera: Conjunctivae normal.  Pulmonary:     Effort: Pulmonary effort is normal.     Breath sounds: No stridor.  Musculoskeletal:     Comments: Left knee is without any redness, warmth to touch, there is a small effusion present with positive ballottement exam.  Forage motion of left knee.  Good distal pulses 3+ and symmetric to the DP and PT artery  Sensation is intact.  Ambulatory with small limp due to pain.  Skin:    General: Skin is warm and dry.  Neurological:     Mental Status: She is alert and oriented to person, place, and time. Mental status is at baseline.     ED Results / Procedures / Treatments   Labs (all labs ordered are listed, but only abnormal results are displayed) Labs  Reviewed  CBG MONITORING, ED - Abnormal; Notable for the following components:      Result Value   Glucose-Capillary 115 (*)    All other components within normal limits    EKG None  Radiology DG Knee Complete 4 Views Left  Result Date: 10/16/2020 CLINICAL DATA:  Left knee pain for 2 hours EXAM: LEFT KNEE - COMPLETE 4+ VIEW COMPARISON:  None. FINDINGS: Frontal, bilateral oblique, lateral views of the left knee are obtained. There is mild to moderate 3 compartmental osteoarthritis with joint space narrowing, marginal osteophyte formation, and chondrocalcinosis of the medial and lateral compartments. No fracture, subluxation, or dislocation. Small suprapatellar joint effusion. Remaining soft tissues are unremarkable. IMPRESSION: 1. Mild to moderate 3 compartmental left knee osteoarthritis, with likely reactive joint effusion. 2. No acute fracture. Electronically Signed   By: Randa Ngo M.D.   On: 10/16/2020 22:39    Procedures Procedures   Medications Ordered in ED Medications  oxyCODONE-acetaminophen (PERCOCET/ROXICET) 5-325 MG per tablet 1 tablet (1 tablet Oral Given 10/16/20 2356)  acetaminophen (TYLENOL) tablet 650 mg (650 mg Oral Given 10/16/20 2356)    ED Course  I have reviewed the triage vital signs and the nursing notes.  Pertinent labs & imaging results that were available during my care of the patient were reviewed by me and considered in my medical decision making (see chart for details).    MDM Rules/Calculators/A&P                          Patient is 80 year old female presented today with left knee pain.  She has a history of osteoarthritis.  Knee exam is reassuring.  She does have a small effusion.  This is likely related to arthritis.  Obtain x-ray.  Distal neurovascular status is reassuring.   IMPRESSION:  1. Mild to moderate 3 compartmental left knee osteoarthritis, with  likely reactive joint effusion.  2. No acute fracture.   Doubt septic arthritis  given her normal range of motion and benign appearance.  She  will follow-up with her orthopedist tomorrow.  I recommend that she discuss her symptoms with him.  We will discharge at this time.  I provided patient with prednisone to take twice daily for the next 3 days.  And Tylenol.  Final Clinical Impression(s) / ED Diagnoses Final diagnoses:  Left knee pain, unspecified chronicity    Rx / DC Orders ED Discharge Orders         Ordered    predniSONE (DELTASONE) 10 MG tablet  2 times daily with meals        10/16/20 2339    acetaminophen (TYLENOL 8 HOUR ARTHRITIS PAIN) 650 MG CR tablet  Every 8 hours PRN        10/16/20 2339           Tedd Sias, [place] 10/17/20 0109    Little, Wenda [place], MD 10/19/20 2053

## 2020-10-16 NOTE — Discharge Instructions (Signed)
Please follow-up with your orthopedic doctor who replaced your right knee for discussion of possible intervention including surgery, conservative/observational therapy.  I will place you in a knee sleeve today.  Compressive therapy and Tylenol with as milligrams over 6 hours.

## 2020-11-17 ENCOUNTER — Encounter (HOSPITAL_COMMUNITY)
Admission: RE | Admit: 2020-11-17 | Discharge: 2020-11-17 | Disposition: A | Discharge status: Home / Self Care | Payer: Medicare Other | Source: Ambulatory Visit | Attending: Endocrinology | Admitting: Endocrinology

## 2020-11-17 ENCOUNTER — Other Ambulatory Visit: Payer: Self-pay

## 2020-11-17 DIAGNOSIS — M810 Age-related osteoporosis without current pathological fracture: Secondary | ICD-10-CM | POA: Insufficient documentation

## 2020-11-17 MED ORDER — DENOSUMAB 60 MG/ML ~~LOC~~ SOSY
60.0000 mg | PREFILLED_SYRINGE | Freq: Once | SUBCUTANEOUS | Status: AC
  Administered 2020-11-17: 60 mg via SUBCUTANEOUS

## 2020-11-17 MED ORDER — DENOSUMAB 60 MG/ML ~~LOC~~ SOSY
PREFILLED_SYRINGE | SUBCUTANEOUS | Status: AC
  Filled 2020-11-17: qty 1

## 2020-12-04 ENCOUNTER — Ambulatory Visit: Payer: Medicare Other | Admitting: Sports Medicine

## 2020-12-11 ENCOUNTER — Encounter: Payer: Self-pay | Admitting: Sports Medicine

## 2020-12-11 ENCOUNTER — Ambulatory Visit: Payer: Medicare Other | Admitting: Sports Medicine

## 2020-12-11 ENCOUNTER — Other Ambulatory Visit: Payer: Self-pay

## 2020-12-11 DIAGNOSIS — E119 Type 2 diabetes mellitus without complications: Secondary | ICD-10-CM

## 2020-12-11 DIAGNOSIS — B351 Tinea unguium: Secondary | ICD-10-CM | POA: Diagnosis not present

## 2020-12-11 DIAGNOSIS — M79674 Pain in right toe(s): Secondary | ICD-10-CM | POA: Diagnosis not present

## 2020-12-11 DIAGNOSIS — M79675 Pain in left toe(s): Secondary | ICD-10-CM

## 2020-12-11 NOTE — Progress Notes (Signed)
Subjective: Caroline Pratt is a 80 y.o. female patient with history of diabetes who presents to office today complaining of long,mildly painful nails reports that her toes are doing well.  Blood sugars not recorded this visit.  Last visit to PCP few days ago.  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   Unstable knee 05/07/2019   Hardening of the aorta (main artery of the heart) ([place]) 04/16/2019   Degeneration of lumbar intervertebral disc 03/15/2019   Osteoporosis 03/15/2019   Type 1 diabetes mellitus with diabetic polyneuropathy ([place]) 01/10/2019   Age-related osteoporosis without current pathological fracture 06/15/2018   Melanocytic nevi, unspecified 03/03/2018   Pain in right knee 09/26/2017   Long term (current) use of insulin ([place]) 06/13/2017   Hereditary and idiopathic neuropathy, unspecified 08/17/2016   Benign paroxysmal positional vertigo 05/09/2015   Malignant neoplasm of female breast ([place]) 11/28/2014   Personal history of malignant neoplasm of breast 11/28/2014   Encounter for general adult medical examination without abnormal findings 11/26/2014   Hyperlipidemia 12/05/2013   Non-toxic multinodular goiter 12/05/2013   Current Outpatient Medications on File Prior to Visit  Medication Sig Dispense Refill   Glucagon (GVOKE HYPOPEN 2-PACK) 1 MG/0.2ML SOAJ See admin instructions.     acetaminophen (TYLENOL) 650 MG CR tablet 8HR Muscle Aches-Pain 650 mg tablet,extended release  TAKE 1 TABLET (650 MG TOTAL) BY MOUTH EVERY 8 (EIGHT) HOURS AS NEEDED FOR UP TO 14 DAYS FOR PAIN.     aspirin 81 MG EC tablet Take 1 tab po qd      atorvastatin (LIPITOR) 10 MG tablet Take 5 mg by mouth daily.      Continuous Blood Gluc Sensor (FREESTYLE LIBRE 2 SENSOR) MISC CHANGE EVERY 14 DAYS TO MONITOR BLOOD GLUCOSE CONTINUOUSLY     cyclobenzaprine (FLEXERIL) 5 MG tablet cyclobenzaprine 5 mg tablet     denosumab (PROLIA) 60 MG/ML SOSY injection Inject 60mg  every 6 months (start February 2020)     Docusate Sodium (DSS) 100 MG CAPS docusate sodium 100 mg capsule  TAKE 1 CAPSULE EVERY DAY BY ORAL ROUTE FOR 15 DAYS.     empagliflozin (JARDIANCE) 25 MG TABS tablet Take 25 mg by mouth daily.     ezetimibe (ZETIA) 10 MG tablet Take 10 mg by mouth daily.     gabapentin (NEURONTIN) 100 MG capsule Take 200 mg by mouth 2 (two) times daily.      insulin glargine (LANTUS) 100 UNIT/ML Solostar Pen Inject 15 Units into the skin daily.      insulin lispro (HUMALOG) 100 UNIT/ML KiwkPen Inject into the skin 3 (three) times daily. 2 units in the morning, 4 units at lunch, and 6 units at dinner     Insulin Pen Needle (B-D UF III MINI PEN NEEDLES) 31G X 5 MM MISC use as directed with insulin pens 4 times daily, DX: E11.9     Melatonin 10 MG TABS Take 2 tablets by mouth at bedtime.     methocarbamol (ROBAXIN) 500 MG tablet methocarbamol 500 mg tablet  TAKE 1 TABLET TWICE A DAY AN ORALLY AS DIRECTED 30 DAYS     Multiple Vitamins-Minerals (OCUVITE  EXTRA) TABS one po qd     naproxen sodium (ALEVE) 220 MG tablet 1 tablet at bedtime as needed     neomycin-polymyxin-hydrocortisone (CORTISPORIN) OTIC solution Apply 1-2 drops to toe after soaking once daily 10 mL 0   nitrofurantoin, macrocrystal-monohydrate, (MACROBID) 100 MG capsule Take 100 mg by mouth 2 (two) times daily.     oxyCODONE-acetaminophen (PERCOCET/ROXICET) 5-325 MG tablet oxycodone-acetaminophen 5 mg-325 mg tablet  TAKE 1 TABLET BY MOUTH EVERY 6 HOURS AS NEEDED FOR 5 DAYS     predniSONE (DELTASONE) 10 MG tablet prednisone 10 mg tablet  TAKE 2 TABLETS (20 MG TOTAL) BY MOUTH 2 (TWO) TIMES DAILY WITH  A MEAL FOR 3 DAYS.     tiZANidine (ZANAFLEX) 4 MG tablet tizanidine 4 mg tablet     No current facility-administered medications on file prior to visit.   Allergies  Allergen Reactions   Abaloparatide Other (See Comments)   Metformin Hcl     Other reaction(s): dry mouth    Recent Results (from the past 2160 hour(s))  CBG monitoring, ED     Status: Abnormal   Collection Time: 10/16/20 11:23 PM  Result Value Ref Range   Glucose-Capillary 115 (H) 70 - 99 mg/dL    Comment: Glucose reference range applies only to samples taken after fasting for at least 8 hours.    Objective: General: Patient is awake, alert, and oriented x 3 and in no acute distress.  Integument: Skin is warm, dry and supple bilateral. Nails are tender, long, thickened and dystrophic with subungual debris, consistent with onychomycosis, 1-5 bilateral.  No acute ingrowing noted at this visit.  No other signs of infection. No open lesions or preulcerative lesions present bilateral. Remaining integument unremarkable.  Vasculature:  Dorsalis Pedis pulse 1/4 bilateral. Posterior Tibial pulse 1/4 bilateral. Capillary fill time <5 sec 1-5 bilateral.  Scant hair growth to the level of the digits.Temperature gradient within normal limits. No varicosities present bilateral. No edema present bilateral.   Neurology: Gross sensation intact bilateral.   Musculoskeletal: Asymptomatic bunion and hammertoe deformity bilateral.  Pes planus foot type.  No tenderness with calf compression bilateral.  Assessment and Plan: Problem List Items Addressed This Visit   None Visit Diagnoses     Pain due to onychomycosis of toenails of both feet    -  Primary   Diabetes mellitus without complication (HCC)       Relevant Medications   Glucagon (GVOKE HYPOPEN 2-PACK) 1 MG/0.2ML SOAJ       -Examined patient. -Discussed the importance of daily foot inspection in the setting of diabetes -Mechanically debrided nails x10 using a sterile nail  nipper without incident -There are no acute ingrowing issues noted at this visit but did advise patient if her toes get sore to soak with warm water and Epson salt and to return to office sooner -Patient to return as scheduled every 10 to 12 weeks for routine nail care or sooner if problems or issues arise Landis Martins, DPM

## 2021-01-26 ENCOUNTER — Ambulatory Visit: Payer: Medicare Other | Admitting: Podiatry

## 2021-01-28 ENCOUNTER — Ambulatory Visit: Payer: Medicare Other | Admitting: Podiatry

## 2021-01-28 ENCOUNTER — Other Ambulatory Visit: Payer: Self-pay

## 2021-01-28 DIAGNOSIS — L600 Ingrowing nail: Secondary | ICD-10-CM

## 2021-01-28 MED ORDER — GENTAMICIN SULFATE 0.1 % EX CREA: 1.0000 "application " | TOPICAL_CREAM | Freq: Two times a day (BID) | CUTANEOUS | 1 refills | Status: AC

## 2021-01-28 NOTE — Progress Notes (Signed)
   Subjective: Patient presents today for evaluation of pain to the bilateral great toes both medial and lateral borders. Patient is concerned for possible ingrown nail.  It is very sensitive to touch.  Patient states that she has had ingrown toenails off and on for several years now despite routine debridements.  They are very sensitive and painful and she would like something further to address the ingrown's patient presents today for further treatment and evaluation.  Past Medical History:  Diagnosis Date   Breast cancer ([place])    Diabetes ([place])    type 2    High cholesterol    History of shingles    lefy eye    Neuropathy    Osteoarthritis    Osteoporosis     Objective:  General: Well developed, nourished, in no acute distress, alert and oriented x3   Dermatology: Skin is warm, dry and supple bilateral.  Medial and lateral border of the bilateral great toes appears to be erythematous with evidence of an ingrowing nail. Pain on palpation noted to the border of the nail fold. The remaining nails appear unremarkable at this time. There are no open sores, lesions.  Vascular: Dorsalis Pedis artery and Posterior Tibial artery pedal pulses palpable. No lower extremity edema noted.   Neruologic: Grossly intact via light touch bilateral.  Musculoskeletal: Muscular strength within normal limits in all groups bilateral. Normal range of motion noted to all pedal and ankle joints.   Assesement: #1 Paronychia with ingrowing nail medial and lateral border bilateral great toes #2 Pain in toe  Plan of Care:  1. Patient evaluated.  2. Discussed treatment alternatives and plan of care. Explained nail avulsion procedure and post procedure course to patient. 3. Patient opted for permanent partial nail avulsion of the bilateral great toenails medial and lateral border.  4. Prior to procedure, local anesthesia infiltration utilized using 3 ml of a 50:50 mixture of 2% plain lidocaine and 0.5% plain  marcaine in a normal hallux block fashion and a betadine prep performed.  5. Partial permanent nail avulsion with chemical matrixectomy performed using [id number] applications of phenol followed by alcohol flush.  6. Light dressing applied.  Post care instructions provided 7.  Prescription for gentamicin 2% cream  8.  Return to clinic 2 weeks.  Edrick Kins, DPM [facility]  Dr. Edrick Kins, DPM    [address], [place] [address]                Office [phone]  Fax [phone]

## 2021-02-03 ENCOUNTER — Other Ambulatory Visit: Payer: Self-pay

## 2021-02-03 ENCOUNTER — Ambulatory Visit: Payer: Medicare Other | Admitting: Podiatry

## 2021-02-03 DIAGNOSIS — L600 Ingrowing nail: Secondary | ICD-10-CM

## 2021-02-03 MED ORDER — CEPHALEXIN 500 MG PO CAPS: 500.0000 mg | ORAL_CAPSULE | Freq: Three times a day (TID) | ORAL | 0 refills | Status: AC

## 2021-02-04 NOTE — Progress Notes (Signed)
  Subjective:  Patient ID: Caroline Pratt, female    DOB: [birth year],  MRN: [id number]  Chief Complaint  Patient presents with   Ingrown Toenail    Bil painful ingrown - discharge     80 y.o. female returns for urgent visit she had a nail procedure done last week by Dr. Amalia Hailey and has some redness and discharge  Objective:  Physical Exam: Matricectomy sites appear to be healing well there is some persistent erythema especially on the left side, all adjacent to the matricectomy sites none extending up the toe, no purulence no malodor Assessment:   1. Ingrown toenail of both feet      Plan:  Patient was evaluated and treated and all questions answered.  Evaluated the patient discussed with him that I think this is likely persistent phenol reaction I discussed this with the patient and her husband.  I did prescribe Keflex as a precaution.  Continue soaking and ointments as directed.  Return in 1 week for evaluation with Dr. Amalia Hailey  No follow-ups on file.

## 2021-02-09 ENCOUNTER — Ambulatory Visit: Payer: Medicare Other | Admitting: Podiatry

## 2021-02-11 ENCOUNTER — Ambulatory Visit: Payer: Medicare Other | Admitting: Podiatry

## 2021-02-16 ENCOUNTER — Other Ambulatory Visit: Payer: Self-pay

## 2021-02-16 ENCOUNTER — Ambulatory Visit: Payer: Medicare Other | Admitting: Podiatry

## 2021-02-16 DIAGNOSIS — L600 Ingrowing nail: Secondary | ICD-10-CM | POA: Diagnosis not present

## 2021-02-16 NOTE — Progress Notes (Signed)
   Subjective: 80 y.o. female presents today status post permanent nail avulsion procedure of the medial lateral border bilateral great toes that was performed on 01/28/2021.  Patient states that she is feeling very well.  She says the pain is improved significantly.  She no longer has her experiences the ingrown toenail pain.  No new complaints at this time.  She has been applying the antibiotic cream and soaking the foot as instructed.  Past Medical History:  Diagnosis Date   Breast cancer ([place])    Diabetes ([place])    type 2    High cholesterol    History of shingles    lefy eye    Neuropathy    Osteoarthritis    Osteoporosis     Objective: Skin is warm, dry and supple. Nail and respective nail fold appears to be healing appropriately. Open wound to the associated nail fold with a granular wound base and moderate amount of fibrotic tissue. Minimal drainage noted. Mild erythema around the periungual region likely due to phenol chemical matricectomy.  Assessment: #1 s/p partial permanent nail matrixectomy medial and lateral border bilateral great toes   Plan of care: #1 patient was evaluated  #2 light debridement of open wound was performed to the periungual border of the respective toe using a currette. Antibiotic ointment and Band-Aid was applied. #3 patient is to return to clinic on a PRN basis.   Edrick Kins, DPM [facility]  Dr. Edrick Kins, DPM    [address], [place] [address]                Office [phone]  Fax [phone]

## 2021-02-17 ENCOUNTER — Ambulatory Visit: Payer: Medicare Other | Admitting: Podiatry

## 2021-02-19 ENCOUNTER — Ambulatory Visit: Payer: Medicare Other | Admitting: Sports Medicine

## 2021-03-10 ENCOUNTER — Encounter: Payer: Self-pay | Admitting: *Deleted

## 2021-03-11 ENCOUNTER — Ambulatory Visit: Payer: Medicare Other | Admitting: Diagnostic Neuroimaging

## 2021-03-11 ENCOUNTER — Encounter: Payer: Self-pay | Admitting: Diagnostic Neuroimaging

## 2021-03-11 ENCOUNTER — Telehealth: Payer: Self-pay | Admitting: Diagnostic Neuroimaging

## 2021-03-11 VITALS — BP 121/68 | HR 58 | Ht 65.0 in | Wt 124.0 lb

## 2021-03-11 DIAGNOSIS — R413 Other amnesia: Secondary | ICD-10-CM | POA: Diagnosis not present

## 2021-03-11 MED ORDER — MEMANTINE HCL 10 MG PO TABS: 10.0000 mg | ORAL_TABLET | Freq: Two times a day (BID) | ORAL | 12 refills | Status: AC

## 2021-03-11 NOTE — Telephone Encounter (Signed)
[employer] medicare order sent to GI. NPR they will reach out to the patient to schedule.  

## 2021-03-11 NOTE — Progress Notes (Signed)
[facility]  PATIENT: Caroline Pratt DOB: 1940/08/17  REFERRING CLINICIAN: [name], MD HISTORY FROM: patient nd husband and chart review  REASON FOR VISIT: new consult    HISTORICAL  CHIEF COMPLAINT:  Chief Complaint  Patient presents with   Memory Loss    Rm 6 New Pt dgtr- Neoma Laming, husbandLiliane Channel  MMSE 18    HISTORY OF PRESENT ILLNESS:   UPDATE (03/11/21, VRP): 80 year old female here for evaluation of memory loss.  Patient reports some mild memory loss over the past 1 year.  Her family has noticed similar problems since fall 2021.  She is having gradual onset progressive short-term memory loss, confusion with driving directions, difficulty managing cooking, household chores, conversations and remembering time relationships.  She is able to maintain her personal dressing, hygiene, toileting issues.  She is not able to function completely independently outside of the home.   PRIOR HPI (11/11/17): 80 year old female with lower extremity pain and numbness.  In 2008 patient fell down and had a right knee injury.  She "tore up" her right knee and had severe pain.  This was treated conservatively but ultimately she required arthroscopic procedure and then right total knee replacement in 2009.  Since that time she has continued to have pain around her right knee and right leg.  Now pain has spread to her right hip, thigh, knee, lower leg and foot.  Possibility of lumbar radiculopathy was raised and therefore patient had MRI of the lumbar spine. She had degenerative spine disease without significant nerve root compression.  Around 2012 patient was diagnosed with diabetes.  She was initially treated with metformin, then switched to insulin.  Hemoglobin A1c has ranged from 6.2 up to 7.2 currently.  2018 patient has had increasing cramps in her legs.  She is also having intermittent numbness in her hands.  She has intermittent numbness and tingling in her lower  tremors.  Patient's diabetes is currently being managed by Dr. Forde Dandy.  He diagnosed diabetic nerve pain and started patient on Lyrica 50 mg at bedtime.  This seems to help a little bit with symptoms but medication is a very expensive.  Patient has seen a variety of orthopedic surgeons for evaluation.  No surgical treatment options have been identified.  Patient referred here for neurologic consultation and consideration of possible EMG nerve conduction study to better define etiology of symptoms.   REVIEW OF SYSTEMS: Full 14 system review of systems performed and negative with exception of: Numbness.  ALLERGIES: Allergies  Allergen Reactions   Abaloparatide Other (See Comments)   Metformin Hcl     Other reaction(s): dry mouth    HOME MEDICATIONS: Outpatient Medications Prior to Visit  Medication Sig Dispense Refill   aspirin 81 MG EC tablet Take 1 tab po qd     atorvastatin (LIPITOR) 10 MG tablet Take 5 mg by mouth daily.     Continuous Blood Gluc Receiver (FREESTYLE LIBRE 2 READER) DEVI      Continuous Blood Gluc Sensor (FREESTYLE LIBRE 2 SENSOR) MISC      cyclobenzaprine (FLEXERIL) 5 MG tablet      denosumab (PROLIA) 60 MG/ML SOSY injection Inject '60mg'$  every 6 months (start February 2020)     empagliflozin (JARDIANCE) 25 MG TABS tablet Take 25 mg by mouth daily.     ezetimibe (ZETIA) 10 MG tablet Take 10 mg by mouth daily.     gabapentin (NEURONTIN) 100 MG capsule Take 200 mg by mouth 2 (two) times daily.  gentamicin cream (GARAMYCIN) 0.1 % Apply 1 application topically 2 (two) times daily. 30 g 1   Glucagon (GVOKE HYPOPEN 2-PACK) 1 MG/0.2ML SOAJ See admin instructions.     insulin glargine (LANTUS) 100 UNIT/ML Solostar Pen Inject 15 Units into the skin daily.      insulin lispro (HUMALOG) 100 UNIT/ML KiwkPen Inject into the skin 3 (three) times daily. 2 units in the morning, 4 units at lunch, and 6 units at dinner     Melatonin 10 MG TABS Take 2 tablets by mouth at bedtime.      methocarbamol (ROBAXIN) 500 MG tablet methocarbamol 500 mg tablet  TAKE 1 TABLET TWICE A DAY AN ORALLY AS DIRECTED 30 DAYS     Multiple Vitamins-Minerals (OCUVITE EXTRA) TABS one po qd     naproxen sodium (ALEVE) 220 MG tablet 1 tablet at bedtime as needed     neomycin-polymyxin-hydrocortisone (CORTISPORIN) OTIC solution Apply 1-2 drops to toe after soaking once daily 10 mL 0   nitrofurantoin, macrocrystal-monohydrate, (MACROBID) 100 MG capsule Take 100 mg by mouth 2 (two) times daily.     tiZANidine (ZANAFLEX) 4 MG tablet tizanidine 4 mg tablet     HYDROcodone-acetaminophen (NORCO/VICODIN) 5-325 MG tablet hydrocodone 5 mg-acetaminophen 325 mg tablet  TAKE 1 TABLET BY MOUTH EVERY 6 HOURS FOR 5 DAYS (Patient not taking: Reported on 03/11/2021)     Insulin Pen Needle (B-D UF III MINI PEN NEEDLES) 31G X 5 MM MISC use as directed with insulin pens 4 times daily, DX: E11.9 (Patient not taking: Reported on 03/11/2021)     oxyCODONE-acetaminophen (PERCOCET/ROXICET) 5-325 MG tablet oxycodone-acetaminophen 5 mg-325 mg tablet  TAKE 1 TABLET BY MOUTH EVERY 6 HOURS AS NEEDED FOR 5 DAYS (Patient not taking: Reported on 03/11/2021)     predniSONE (DELTASONE) 10 MG tablet prednisone 10 mg tablet  TAKE 2 TABLETS (20 MG TOTAL) BY MOUTH 2 (TWO) TIMES DAILY WITH A MEAL FOR 3 DAYS. (Patient not taking: Reported on 03/11/2021)     acetaminophen (TYLENOL) 650 MG CR tablet 8HR Muscle Aches-Pain 650 mg tablet,extended release  TAKE 1 TABLET (650 MG TOTAL) BY MOUTH EVERY 8 (EIGHT) HOURS AS NEEDED FOR UP TO 14 DAYS FOR PAIN.     Docusate Sodium (DSS) 100 MG CAPS docusate sodium 100 mg capsule  TAKE 1 CAPSULE EVERY DAY BY ORAL ROUTE FOR 15 DAYS.     No facility-administered medications prior to visit.    PAST MEDICAL HISTORY: Past Medical History:  Diagnosis Date   Atherosclerosis of aorta ([place])    Breast cancer ([place])    Diabetes ([place])    type 2    Goiter    High cholesterol    History of shingles    lefy eye     Memory deficit    Neuropathy    Osteoarthritis    Osteoporosis     PAST SURGICAL HISTORY: Past Surgical History:  Procedure Laterality Date   CATARACT EXTRACTION Bilateral    EYE SURGERY     shingles in the eye  ,    MASTECTOMY Left 80 yo   REPLACEMENT TOTAL KNEE Right 2009   TOTAL KNEE REVISION Right 05/28/2019   Procedure: Right knee polyethylene exchange ;  Surgeon: Gaynelle Arabian, MD;  Location: [facility];  Service: Orthopedics;  Laterality: Right;  133mn    FAMILY HISTORY: Family History  Problem Relation Age of Onset   Stroke Mother    Heart attack Father     SOCIAL HISTORY:  Social History  Socioeconomic History   Marital status: Married    Spouse name: Liliane Channel   Number of children: 2   Years of education: Not on file   Highest education level: High school graduate  Occupational History   Not on file  Tobacco Use   Smoking status: Never   Smokeless tobacco: Never  Vaping Use   Vaping Use: Never used  Substance and Sexual Activity   Alcohol use: Never   Drug use: Never   Sexual activity: Not on file  Other Topics Concern   Not on file  Social History Narrative   03/11/21 She lives at home with her husband [name]   Left handed   Caffeine: 3 cups daily   Social Determinants of Health   Financial Resource Strain: Not on file  Food Insecurity: Not on file  Transportation Needs: Not on file  Physical Activity: Not on file  Stress: Not on file  Social Connections: Not on file  Intimate Partner Violence: Not on file     PHYSICAL EXAM  GENERAL EXAM/CONSTITUTIONAL: Vitals:  Vitals:   03/11/21 1013  BP: 121/68  Pulse: (!) 58  Weight: 124 lb (56.2 kg)  Height: '5\' 5"'$  (1.651 m)   Body mass index is 20.63 kg/m. No results found. Patient is in no distress; well developed, nourished and groomed; neck is supple  CARDIOVASCULAR: Examination of carotid arteries is normal; no carotid bruits Regular rate and rhythm, no murmurs Examination of peripheral  vascular system by observation and palpation is normal  EYES: Ophthalmoscopic exam of optic discs and posterior segments is normal; no papilledema or hemorrhages  MUSCULOSKELETAL: Gait, strength, tone, movements noted in Neurologic exam below  NEUROLOGIC: MENTAL STATUS:  MMSE - [place] Exam 03/11/2021  Orientation to time 2  Orientation to Place 4  Registration 3  Attention/ Calculation 0  Recall 2  Language- name 2 objects 2  Language- repeat 0  Language- follow 3 step command 3  Language- read & follow direction 1  Write a sentence 1  Copy design 0  Total score 18   awake, alert, oriented to person [facility] memory  DECR attention and concentration language fluent, comprehension intact, naming intact,  fund of knowledge appropriate DECR INSIGHT  CRANIAL NERVE:  2nd - no papilledema on fundoscopic exam 2nd, 3rd, 4th, 6th - pupils equal and reactive to light, visual fields full to confrontation, extraocular muscles intact, no nystagmus 5th - facial sensation symmetric 7th - facial strength symmetric 8th - hearing intact 9th - palate elevates symmetrically, uvula midline 11th - shoulder shrug symmetric 12th - tongue protrusion midline  MOTOR:  MILD MOTOR APRAXIA normal bulk and tone, full strength in the BUE, BLE  SENSORY:  normal and symmetric to light touch, temperature, vibration  COORDINATION:  finger-nose-finger, fine finger movements normal  REFLEXES:  deep tendon reflexes TRACE and symmetric; ABSENT AT ANKLES  GAIT/STATION:  narrow based gait    DIAGNOSTIC DATA (LABS, IMAGING, TESTING) - I reviewed patient records, labs, notes, testing and imaging myself where available.  Lab Results  Component Value Date   WBC 11.6 (H) 05/29/2019   HGB 12.9 05/29/2019   HCT 40.5 05/29/2019   MCV 96.0 05/29/2019   PLT 266 05/29/2019      Component Value Date/Time   NA 137 05/29/2019 0337   K 4.6 05/29/2019 0337   CL 108 05/29/2019 0337   CO2 15 (L)  05/29/2019 0337   GLUCOSE 210 (H) 05/29/2019 0337   BUN 15 05/29/2019 [id number]  CREATININE 0.71 05/29/2019 0337   CALCIUM 8.6 (L) 05/29/2019 0337   PROT 5.7 (L) 05/24/2019 1122   ALBUMIN 3.3 (L) 05/24/2019 1122   AST 25 05/24/2019 1122   ALT 21 05/24/2019 1122   ALKPHOS 56 05/24/2019 1122   BILITOT 0.8 05/24/2019 1122   GFRNONAA >60 05/29/2019 0337   GFRAA >60 05/29/2019 0337   No results found for: CHOL, HDL, LDLCALC, LDLDIRECT, TRIG, CHOLHDL Lab Results  Component Value Date   HGBA1C 7.0 (H) 05/24/2019   No results found for: VITAMINB12 No results found for: TSH   10/21/17 MRI lumbar spine [I reviewed images myself and agree with interpretation. -VRP]  1. Mild multilevel degenerative changes of the lumbar spine as described above, slightly progressed when compared to prior study. Unchanged mild left neuroforaminal stenosis at L3-L4 and L4-L5. 2. No significant spinal canal stenosis at any level.     ASSESSMENT AND PLAN  80 y.o. year old female here with progressive memory loss and decline in ADLs, MMSE 18 of 30, consistent with neurodegenerative dementia.   Dx:   1. Memory loss      PLAN:  MEMORY LOSS (since 2021; MMSE 18/30; change in ADLs; consistent with mild-mod dementia) - check MRI brain - start memantine '10mg'$  at bedtime; increase to twice a day after 1-2 weeks - safety / supervision issues reviewed - daily physical activity / exercise (at least 15-30 minutes) - eat more plants / vegetables - increase social activities, brain stimulation, games, puzzles, hobbies, crafts, arts, music - aim for at least 7-8 hours sleep per night (or more) - avoid smoking and alcohol - caregiver resources provided - caution with medications, finances; no driving  Meds ordered this encounter  Medications   memantine (NAMENDA) 10 MG tablet    Sig: Take 1 tablet (10 mg total) by mouth 2 (two) times daily.    Dispense:  60 tablet    Refill:  12    Orders Placed This  Encounter  Procedures   MR [place]    Return for pending if symptoms worsen or fail to improve, pending test results.    Penni Bombard, MD [id number], [id number] AM Certified in Neurology, Neurophysiology and Neuroimaging  [facility] [address], [place], [place] [address] [phone]

## 2021-03-11 NOTE — Patient Instructions (Signed)
MEMORY LOSS (MMSE 18/30; change in ADLs; consistent with mild-mod dementia) - check MRI brain - start memantine '10mg'$  at bedtime; increase to twice a day after 1-2 weeks - safety / supervision issues reviewed - daily physical activity / exercise (at least 15-30 minutes) - eat more plants / vegetables - increase social activities, brain stimulation, games, puzzles, hobbies, crafts, arts, music - aim for at least 7-8 hours sleep per night (or more) - avoid smoking and alcohol - caregiver resources provided - caution with medications, finances; no driving

## 2021-03-23 ENCOUNTER — Other Ambulatory Visit: Payer: Self-pay

## 2021-03-23 ENCOUNTER — Ambulatory Visit
Admission: RE | Admit: 2021-03-23 | Discharge: 2021-03-23 | Disposition: A | Discharge status: Home / Self Care | Payer: Medicare Other | Source: Ambulatory Visit | Attending: Diagnostic Neuroimaging | Admitting: Diagnostic Neuroimaging

## 2021-03-23 DIAGNOSIS — R413 Other amnesia: Secondary | ICD-10-CM

## 2021-03-23 MED ORDER — GADOBENATE DIMEGLUMINE 529 MG/ML IV SOLN
11.0000 mL | Freq: Once | INTRAVENOUS | Status: AC | PRN
  Administered 2021-03-23: 11 mL via INTRAVENOUS

## 2021-03-25 ENCOUNTER — Other Ambulatory Visit: Payer: Medicare Other

## 2021-03-30 ENCOUNTER — Telehealth: Payer: Self-pay | Admitting: Diagnostic Neuroimaging

## 2021-03-30 NOTE — Telephone Encounter (Signed)
Pt's husband is asking for a call re: what is next as a result of MRI, please call

## 2021-04-01 NOTE — Telephone Encounter (Signed)
Called husband and informed him her MRI brain showed atrophy consistent with dementia. No other new or major findings. Continue current plan per MD last note. I reviewed plan with him per Dr Gladstone Lighter note. He stated she is due to renew her license. I advised again that Dr Gladstone Lighter note stated no driving. He acknowledged.  He then asked if a med can be prescribed; I advised Rx was sent to [facility] on [facility]/11[facility]21 for memantine. He stated [facility] never called. I advised he call [facility], start med one tab at bedtime x 1-2 weeks then increase to one tab twice daily. He verbalized understanding, appreciation.

## 2021-08-03 ENCOUNTER — Other Ambulatory Visit (HOSPITAL_COMMUNITY): Payer: Self-pay

## 2021-08-04 ENCOUNTER — Ambulatory Visit (HOSPITAL_COMMUNITY)
Admission: RE | Admit: 2021-08-04 | Discharge: 2021-08-04 | Disposition: A | Discharge status: Home / Self Care | Payer: Medicare Other | Source: Ambulatory Visit | Attending: Endocrinology | Admitting: Endocrinology

## 2021-08-04 DIAGNOSIS — M810 Age-related osteoporosis without current pathological fracture: Secondary | ICD-10-CM | POA: Diagnosis present

## 2021-08-04 MED ORDER — DENOSUMAB 60 MG/ML ~~LOC~~ SOSY: 60.0000 mg | PREFILLED_SYRINGE | Freq: Once | SUBCUTANEOUS | Status: AC

## 2021-08-04 MED ORDER — DENOSUMAB 60 MG/ML ~~LOC~~ SOSY
PREFILLED_SYRINGE | SUBCUTANEOUS | Status: AC
  Administered 2021-08-04: 60 mg via SUBCUTANEOUS
  Filled 2021-08-04: qty 1

## 2022-01-24 IMAGING — DX DG KNEE COMPLETE 4+V*L*
4 series · 4 of 4 positions shown · non-contrast
Comparison: None.

CLINICAL DATA: Left knee pain for 2 hours

EXAM:
LEFT KNEE - COMPLETE 4+ VIEW

[knee ap]
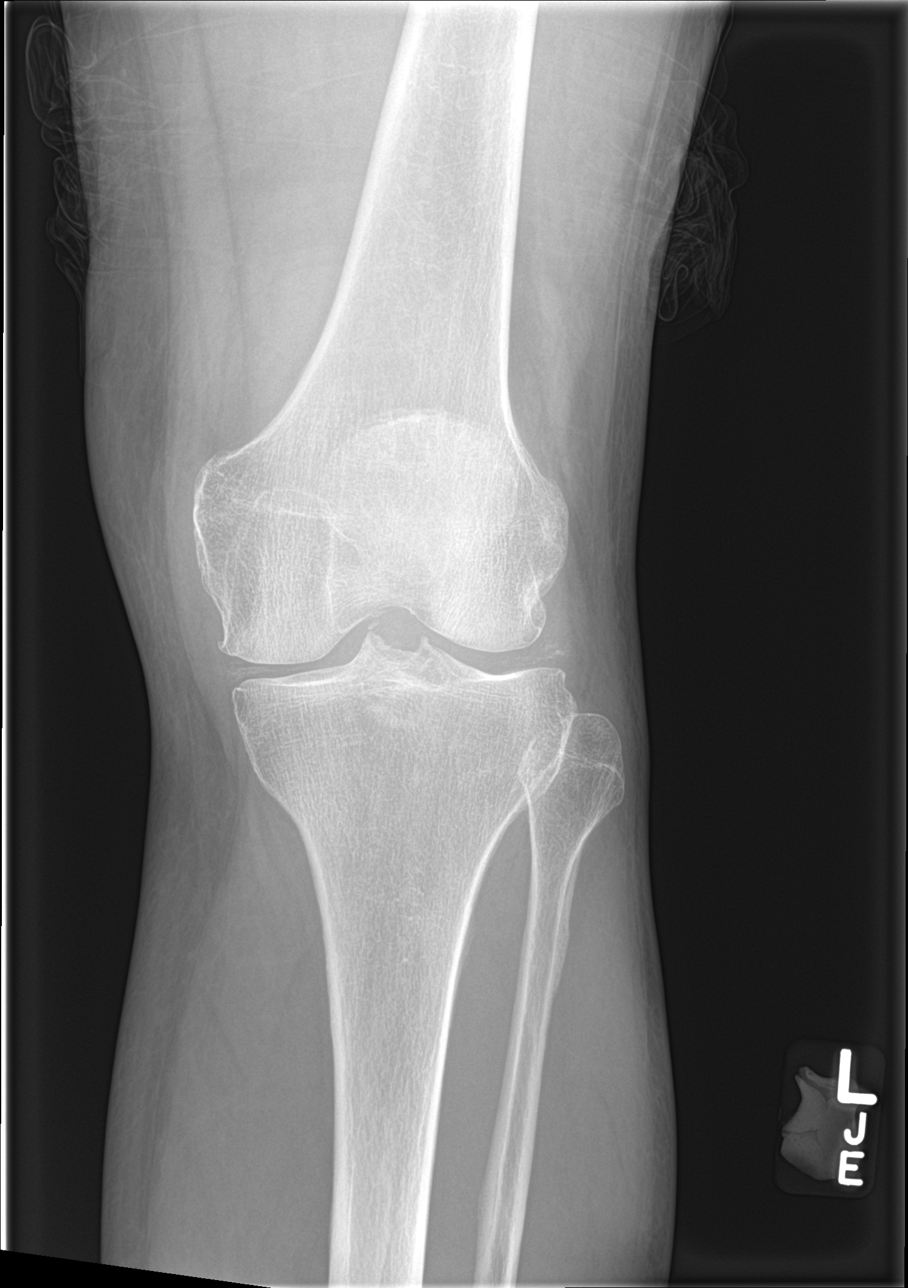

[knee lat]
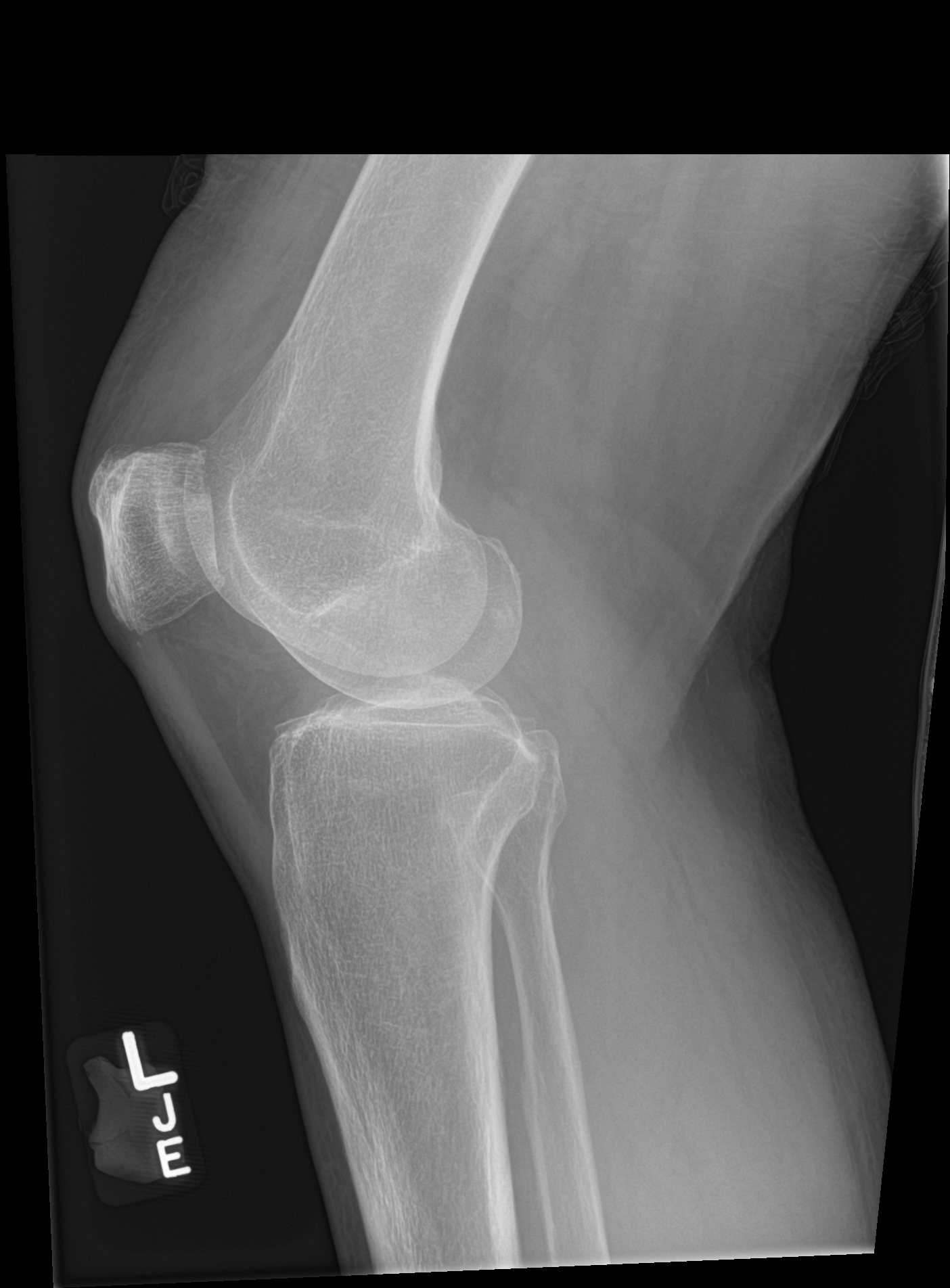

[knee obl (1 of 2)]
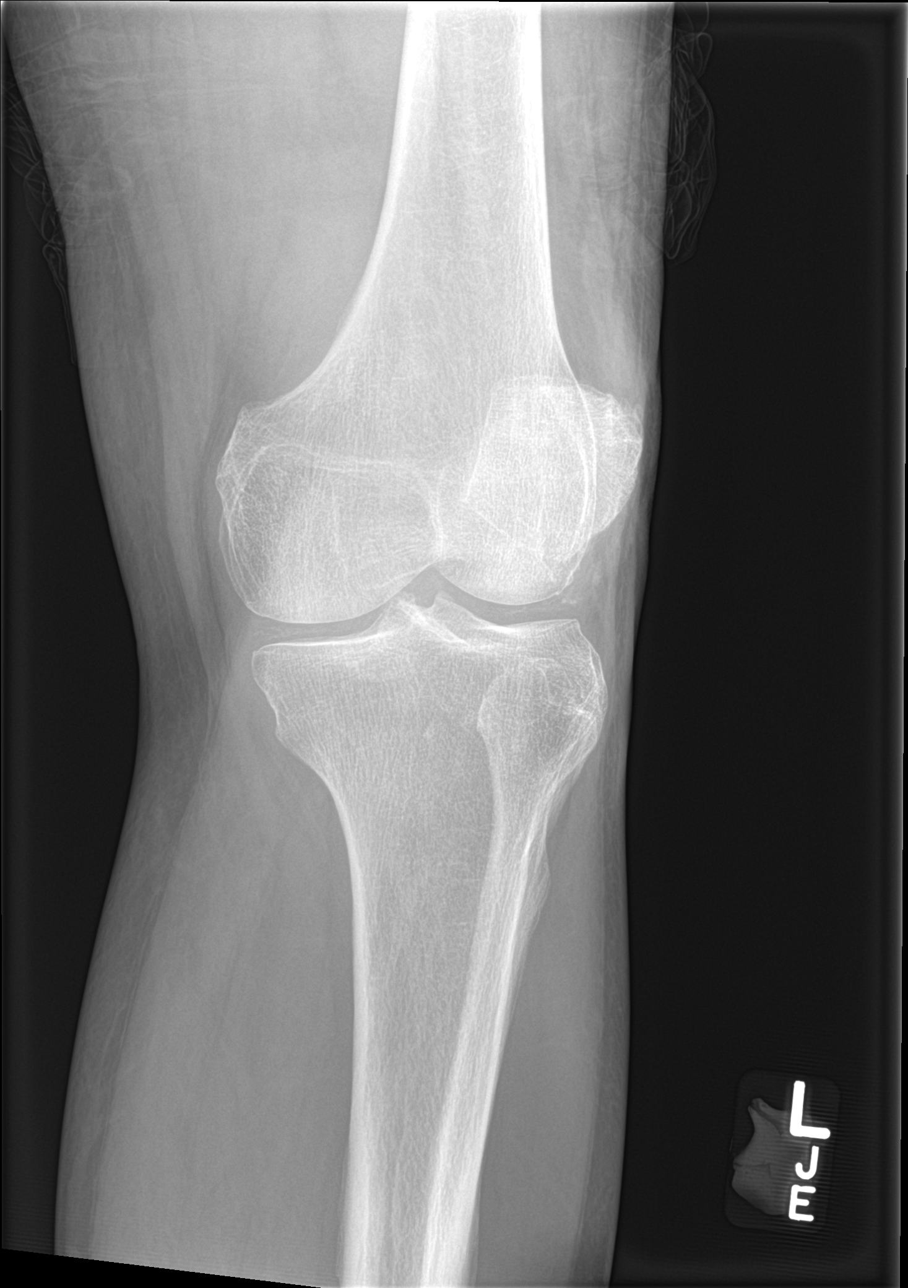

[knee obl (2 of 2)]
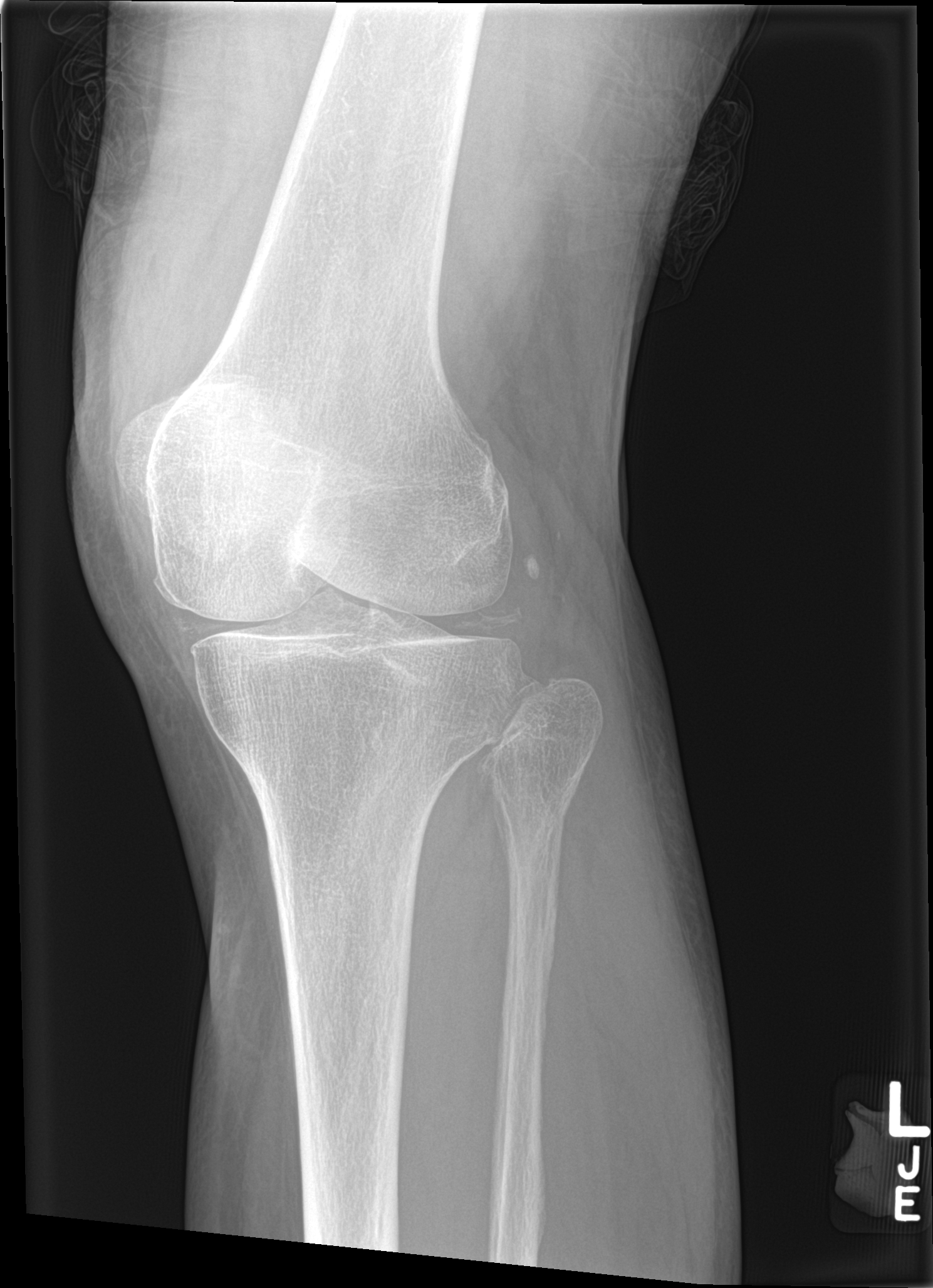

[4 of 4 positions shown; findings below may reference images not displayed]

FINDINGS: Frontal, bilateral oblique, lateral views of the left knee are
obtained. There is mild to moderate 3 compartmental osteoarthritis
with joint space narrowing, marginal osteophyte formation, and
chondrocalcinosis of the medial and lateral compartments. No
fracture, subluxation, or dislocation. Small suprapatellar joint
effusion. Remaining soft tissues are unremarkable.
IMPRESSION: 1. Mild to moderate 3 compartmental left knee osteoarthritis, with
likely reactive joint effusion.
2. No acute fracture.

## 2022-03-18 ENCOUNTER — Telehealth: Payer: Self-pay

## 2022-03-18 NOTE — Telephone Encounter (Signed)
Attempted to contact patient to schedule a Palliative Care consult appointment. No answer left a message to return call.  

## 2022-03-24 ENCOUNTER — Telehealth: Payer: Self-pay

## 2022-03-24 NOTE — Telephone Encounter (Signed)
Spoke with this patient's spouse Liliane Channel regarding palliative Care. He states patient is supposed to have a hospice AV. He is unsure with which company. Have sent a email to hospice side to advise if they have a referral.

## 2022-07-05 DEATH — deceased
